# Patient Record
Sex: Female | Born: 1955 | Race: White | Hispanic: No | Marital: Married | Smoking: Never smoker
Health system: Southern US, Community
[De-identification: ages and names within clinical notes are randomized; demographics above are authoritative.]

## PROBLEM LIST (undated history)

## (undated) DIAGNOSIS — R9439 Abnormal result of other cardiovascular function study: Secondary | ICD-10-CM

## (undated) DIAGNOSIS — N200 Calculus of kidney: Secondary | ICD-10-CM

## (undated) HISTORY — PX: BREAST SURGERY: SHX581

---

## 2017-05-18 ENCOUNTER — Ambulatory Visit: Payer: Self-pay | Admitting: Medical

## 2017-05-18 ENCOUNTER — Encounter: Payer: Self-pay | Admitting: Medical

## 2017-05-18 VITALS — BP 116/82 | HR 88 | Temp 98.4°F | Wt 128.0 lb

## 2017-05-18 DIAGNOSIS — N952 Postmenopausal atrophic vaginitis: Secondary | ICD-10-CM | POA: Insufficient documentation

## 2017-05-18 DIAGNOSIS — N200 Calculus of kidney: Secondary | ICD-10-CM | POA: Insufficient documentation

## 2017-05-18 DIAGNOSIS — R0789 Other chest pain: Secondary | ICD-10-CM

## 2017-05-18 NOTE — Progress Notes (Addendum)
Subjective:    Patient ID: Phyllis Gonzalez, female    DOB: 03/31/1956, 61 y.o.   MRN: [id number]  HPI  61 yo female  5 days ago noticed feeling pressure in the left side back and pressure along the bra line, on occasion all the way across the back. chest and down arm and into the hand decreased sensation.Felt some weakness in the arm and into the hand. Thought initially it was a pinched nerve after doing latisamus dorsi pull downs and chest presses on Wedenday. Did go to the gym yesterday using the elliptical trainer which irritated it again with the same symptoms. However discomfort did not come on till she had stopped and about 30 minutes have gone by. Denies chest pain but felt chest  heaviness and pressure. Has taken nothing for pain as of yet rates the pain  3/10. Similar symptoms  5 years ago and was given a stress test  which was negative. Tingling down arm on Monday, lasted for about 3 minutes. Denies jaw a pain or pain going into the ear. Feels aching, dull , intermittent. No position seems to reproduce  the chest pressure or back discomfort. Denies shortness of breath.   Review of Systems  Constitutional: Negative for chills and fever.  HENT: Negative for congestion, ear pain and sore throat.   Eyes: Negative for pain, discharge and itching.  Respiratory: Negative for cough and shortness of breath.   Cardiovascular: Positive for chest pain.  Gastrointestinal: Negative for abdominal pain.  Endocrine: Negative for cold intolerance and heat intolerance.  Genitourinary: Negative for dysuria and hematuria.  Musculoskeletal: Positive for back pain and neck pain.  Skin: Negative for rash.  Allergic/Immunologic: Negative for environmental allergies and food allergies.  Neurological: Negative for dizziness, syncope and headaches.   Describes pain as pressure and  Discomfort, not pain. A couple of times noted neck pain on the left side recalls no specific movement that causes it to come  on.    Objective:   Physical Exam  Constitutional: She is oriented to person, place, and time. She appears well-developed and well-nourished.  HENT:  Head: Normocephalic and atraumatic.  Right Ear: External ear normal.  Left Ear: External ear normal.  Mouth/Throat: Oropharynx is clear and moist.  Eyes: Pupils are equal, round, and reactive to light. Conjunctivae and EOM are normal.  Neck: Normal range of motion. Neck supple.  Cardiovascular: Normal rate, regular rhythm and normal heart sounds.  Exam reveals no gallop and no friction rub.   No murmur heard. Pulmonary/Chest: Effort normal and breath sounds normal.  Musculoskeletal: Normal range of motion.  Neurological: She is alert and oriented to person, place, and time.  Skin: Skin is warm and dry.  Psychiatric: She has a normal mood and affect. Her behavior is normal. Judgment and thought content normal.  Nursing note and vitals reviewed.  Skin on back within normal limits. Unable to reproduce  pain through palpation chest discomfort or back pain., pushing hands against me causes no pain, . 5/5 strength of external and internal rotation , abduction or adduction all of which do not cause the patient any discomfort. 5/5 grip on the right and 4/5 grip on the left hand.   EKG   77 HR  Non specific lateral T wave changes. Assessment & Plan:  Chest  Pressure and discomfort and back discomfort cardiac vs musculoskelatal pain with radiation of decreased sensation in left arm and hand on occasion.reviewed with Dr,. Gilbert EKG and plan. Recommended  Aspirin 81 mg daily, avoid exercising for now. She would like her referral through her doctor who is located in [place]. She will hopefully see him tomorrow and get a referral to a cardiolgist. We discussed this could be both cardiac and musculoskeletal.  Reviewed EKG with patient. Also reviewed with patient if chest pressure or back discomfort worsens or if she wakes up in the middle of the night  with pain or shortness of breath recommended she call EMS and be further evaluated at the Emergency Department.  She verbalizes understanding and has no questions at discharge.

## 2017-05-18 NOTE — Patient Instructions (Signed)
Take  Aspirin 81 mg daily.  follow up with your doctor tomorrow. Call 911 if chest pain or shortness of breath occurs.  Return to [facility] in 3-5 days if not improving or if any concerns.    Angina Pectoris Angina pectoris is a very bad feeling in the chest, neck, or arm. Your doctor may call it angina. There are four types of angina. Angina is caused by a lack of blood in the middle and thickest layer of the heart wall (myocardium). Angina may feel like a crushing or squeezing pain in the chest. It may feel like tightness or heavy pressure in the chest. Some people say it feels like gas, heartburn, or indigestion. Some people have symptoms other than pain. These include:  Shortness of breath.  Cold sweats.  Feeling sick to your stomach (nausea).  Feeling light-headed.  Many women have chest discomfort and some of the other symptoms. However, women often have different symptoms, such as:  Feeling tired (fatigue).  Feeling nervous for no reason.  Feeling weak for no reason.  Dizziness or fainting.  Women may have angina without any symptoms. Follow these instructions at home:  Take medicines only as told by your doctor.  Take care of other health issues as told by your doctor. These include: ? High blood pressure (hypertension). ? Diabetes.  Follow a heart-healthy diet. Your doctor can help you to choose healthy food options and make changes.  Talk to your doctor to learn more about healthy cooking methods and use them. These include: ? Roasting. ? Grilling. ? Broiling. ? Baking. ? Poaching. ? Steaming. ? Stir-frying.  Follow an exercise program approved by your doctor.  Keep a healthy weight. Lose weight as told by your doctor.  Rest when you are tired.  Learn to manage stress.  Do not use any tobacco, such as cigarettes, chewing tobacco, or electronic cigarettes. If you need help quitting, ask your doctor.  If you drink alcohol, and your doctor says it is okay,  limit yourself to no more than 1 drink per day. One drink equals 12 ounces of beer, 5 ounces of wine, or 1 ounces of hard liquor.  Stop illegal drug use.  Keep all follow-up visits as told by your doctor. This is important. Do not take these medicines unless your doctor says that you can:  Nonsteroidal anti-inflammatory drugs (NSAIDs). These include: ? Ibuprofen. ? Naproxen. ? Celecoxib.  Vitamin supplements that have vitamin A, vitamin E, or both.  Hormone therapy that contains estrogen with or without progestin.  Get help right away if:  You have pain in your chest, neck, arm, jaw, stomach, or back that: ? Lasts more than a few minutes. ? Comes back. ? Does not get better after you take medicine under your tongue (sublingual nitroglycerin).  You have any of these symptoms for no reason: ? Gas, heartburn, or indigestion. ? Sweating a lot. ? Shortness of breath or trouble breathing. ? Feeling sick to your stomach or throwing up. ? Feeling more tired than usual. ? Feeling nervous or worrying more than usual. ? Feeling weak. ? Diarrhea.  You are suddenly dizzy or light-headed.  You faint or pass out. These symptoms may be an emergency. Do not wait to see if the symptoms will go away. Get medical help right away. Call your local emergency services (911 in the [place] Do not drive yourself to the hospital. This information is not intended to replace advice given to you by your health care provider.  Make sure you discuss any questions you have with your health care provider. Document Released: 04/06/2008 Document Revised: 03/26/2016 Document Reviewed: 02/20/2014 Elsevier Interactive Patient Education  2017 [employer]

## 2017-05-28 ENCOUNTER — Encounter: Payer: Self-pay | Admitting: Emergency Medicine

## 2017-05-28 ENCOUNTER — Emergency Department: Payer: BLUE CROSS/BLUE SHIELD

## 2017-05-28 ENCOUNTER — Observation Stay
Admission: EM | Admit: 2017-05-28 | Discharge: 2017-05-31 | Disposition: A | Discharge status: Home / Self Care | Payer: BLUE CROSS/BLUE SHIELD | Attending: Internal Medicine | Admitting: Internal Medicine

## 2017-05-28 DIAGNOSIS — I341 Nonrheumatic mitral (valve) prolapse: Secondary | ICD-10-CM | POA: Diagnosis not present

## 2017-05-28 DIAGNOSIS — R9439 Abnormal result of other cardiovascular function study: Secondary | ICD-10-CM | POA: Insufficient documentation

## 2017-05-28 DIAGNOSIS — I159 Secondary hypertension, unspecified: Secondary | ICD-10-CM

## 2017-05-28 DIAGNOSIS — R0789 Other chest pain: Secondary | ICD-10-CM | POA: Diagnosis not present

## 2017-05-28 DIAGNOSIS — R079 Chest pain, unspecified: Secondary | ICD-10-CM | POA: Diagnosis present

## 2017-05-28 HISTORY — DX: Calculus of kidney: N20.0

## 2017-05-28 HISTORY — DX: Abnormal result of other cardiovascular function study: R94.39

## 2017-05-28 LAB — BASIC METABOLIC PANEL
Anion gap: 9 (ref 5–15)
BUN: 12 mg/dL (ref 6–20)
CHLORIDE: 103 mmol/L (ref 101–111)
CO2: 28 mmol/L (ref 22–32)
Calcium: 10 mg/dL (ref 8.9–10.3)
Creatinine, Ser: 0.72 mg/dL (ref 0.44–1.00)
GFR calc Af Amer: >60 mL/min (ref >60)
GFR calc non Af Amer: >60 mL/min (ref >60)
Glucose, Bld: 95 mg/dL (ref 65–99)
POTASSIUM: 3.5 mmol/L (ref 3.5–5.1)
SODIUM: 140 mmol/L (ref 135–145)

## 2017-05-28 LAB — CBC
HEMATOCRIT: 40.8 % (ref 35.0–47.0)
Hemoglobin: 13.9 g/dL (ref 12.0–16.0)
MCH: 32.3 pg (ref 26.0–34.0)
MCHC: 34 g/dL (ref 32.0–36.0)
MCV: 95.1 fL (ref 80.0–100.0)
Platelets: 216 10*3/uL (ref 150–440)
RBC: 4.29 MIL/uL (ref 3.80–5.20)
RDW: 12.4 % (ref 11.5–14.5)
WBC: 6.7 10*3/uL (ref 3.6–11.0)

## 2017-05-28 LAB — PROTIME-INR
INR: 1.05
Prothrombin Time: 13.7 seconds (ref 11.4–15.2)

## 2017-05-28 LAB — APTT: APTT: 29 s (ref 24–36)

## 2017-05-28 LAB — TROPONIN I: Troponin I: <0.03 ng/mL (ref <0.03)

## 2017-05-28 MED ORDER — ACETAMINOPHEN 650 MG RE SUPP: 650.0000 mg | Freq: Four times a day (QID) | RECTAL | Status: DC | PRN

## 2017-05-28 MED ORDER — ACETAMINOPHEN 325 MG PO TABS
650.0000 mg | ORAL_TABLET | Freq: Four times a day (QID) | ORAL | Status: DC | PRN
  Administered 2017-05-29 – 2017-05-31 (×2): 650 mg via ORAL
  Filled 2017-05-28 (×3): qty 2

## 2017-05-28 MED ORDER — HEPARIN (PORCINE) IN NACL 100-0.45 UNIT/ML-% IJ SOLN
700.0000 [IU]/h | INTRAMUSCULAR | Status: DC
  Administered 2017-05-28 – 2017-05-30 (×2): 700 [IU]/h via INTRAVENOUS
  Filled 2017-05-28 (×3): qty 250

## 2017-05-28 MED ORDER — ASPIRIN 81 MG PO CHEW
324.0000 mg | CHEWABLE_TABLET | Freq: Once | ORAL | Status: AC
  Administered 2017-05-28: 324 mg via ORAL
  Filled 2017-05-28: qty 4

## 2017-05-28 MED ORDER — HEPARIN BOLUS VIA INFUSION
3500.0000 [IU] | Freq: Once | INTRAVENOUS | Status: AC
  Administered 2017-05-28: 3500 [IU] via INTRAVENOUS
  Filled 2017-05-28: qty 3500

## 2017-05-28 MED ORDER — ONDANSETRON HCL 4 MG/2ML IJ SOLN: 4.0000 mg | Freq: Four times a day (QID) | INTRAMUSCULAR | Status: DC | PRN

## 2017-05-28 MED ORDER — ASPIRIN 81 MG PO CHEW
CHEWABLE_TABLET | ORAL | Status: AC
  Filled 2017-05-28: qty 3

## 2017-05-28 MED ORDER — OXYCODONE HCL 5 MG PO TABS: 5.0000 mg | ORAL_TABLET | ORAL | Status: DC | PRN

## 2017-05-28 MED ORDER — NITROGLYCERIN 0.4 MG SL SUBL
0.4000 mg | SUBLINGUAL_TABLET | SUBLINGUAL | Status: DC | PRN
  Administered 2017-05-28 – 2017-05-30 (×2): 0.4 mg via SUBLINGUAL
  Filled 2017-05-28 (×2): qty 1

## 2017-05-28 MED ORDER — ONDANSETRON HCL 4 MG PO TABS: 4.0000 mg | ORAL_TABLET | Freq: Four times a day (QID) | ORAL | Status: DC | PRN

## 2017-05-28 MED ORDER — NITROGLYCERIN 2 % TD OINT
1.0000 [in_us] | TOPICAL_OINTMENT | Freq: Once | TRANSDERMAL | Status: AC
  Administered 2017-05-28: 1 [in_us] via TOPICAL
  Filled 2017-05-28: qty 1

## 2017-05-28 NOTE — ED Notes (Signed)
Pt to stat desk asking to leave.  Pt labs and xray reviewed by this nurse, VS repeated by triage RN.  Pt advised to follow up with cardiologist ASAP and return if sx worsen.  Pt verbalizes understanding.

## 2017-05-28 NOTE — ED Notes (Signed)
Pt transported to room 236 

## 2017-05-28 NOTE — ED Provider Notes (Signed)
MSE was initiated and I personally evaluated the patient and placed orders (if any) at  7:39 PM on May 28, 2017.  The patient appears stable so that the remainder of the MSE may be completed by another provider.  Patient was complaining of chest pain. Does have mildly abnormal EKG. Vital signs are otherwise stable at this time blood work is reassuring that she does state that she had an abnormal stress test was referred to a cardiologist still having persistent chest pain. I have encouraged the patient to stay for complete evaluation.   Willy Eddyobinson, Macallan Ord, MD 05/28/17 [phone]

## 2017-05-28 NOTE — ED Notes (Signed)
Report to floor finished att

## 2017-05-28 NOTE — Progress Notes (Signed)
ANTICOAGULATION CONSULT NOTE - Initial Consult  Pharmacy Consult for Heparin  Indication: chest pain/ACS  Allergies  Allergen Reactions  . Codeine Nausea Only    Patient Measurements:   Heparin Dosing Weight: 58.1 kg   Vital Signs: Temp: 97.8 F (36.6 C) (07/27 1820) Temp Source: Oral (07/27 1820) BP: 156/95 (07/27 2042) Pulse Rate: 73 (07/27 2042)  Labs:  Recent Labs  05/28/17 1820  HGB 13.9  HCT 40.8  PLT 216  CREATININE 0.72  TROPONINI <0.03    CrCl cannot be calculated (Unknown ideal weight.).   Medical History: Past Medical History:  Diagnosis Date  . Abnormal stress test   . Kidney stone     Medications:   (Not in a hospital admission)  Assessment: Pharmacy consulted to dose heparin in this 61 year old female admitted with   Goal of Therapy:  Heparin level 0.3-0.7 units/ml Monitor platelets by anticoagulation protocol: Yes   Plan:  Give 3500 units bolus x 1 Start heparin infusion at 700 units/hr Check anti-Xa level in 6 hours and daily while on heparin Continue to monitor H&H and platelets  Triston Skare D 05/28/2017,9:28 PM

## 2017-05-28 NOTE — ED Notes (Signed)
Dr. Roxan Hockeyobinson in to see pt in triage 1. Pt agreed to stay and be evaluated by physician.

## 2017-05-28 NOTE — ED Provider Notes (Signed)
[facility] Emergency Department Provider Note    None    (approximate)  I have reviewed the triage vital signs and the nursing notes.   HISTORY  Chief Complaint Chest Pain and Back Pain    HPI Luther ParodyRhonda Tessler is a 61 y.o. female presents with 2-1/2 weeks of escalating left chest pain radiating into her back and intermittent left arm tingling. Patient states that the symptoms are happening more frequently and lasting longer in nature. Denies any nausea. No diaphoresis. No syncopal events. Patient did have outpatient stress test which was abnormal.last week and has been referred to cardiology but has not seen a cardiologist. She does take a baby aspirin but is not on any other medications.   WALL SEGMENT CHANGES    RestStress  Anterior Septum:NormalHYPOKINETIC  Anterior Wall:NormalHYPOKINETIC   Lateral Wall:NormalHyperkinetic  Posterior Wall:NormalHyperkinetic  Inferior Wall:NormalHYPOKINETIC  Inferior Septum:NormalHyperkinetic   Apex:NormalHyperkinetic     Resting EF:>55% (Est.) Stress EF: 45% (Est.)    ___________________________________________________________________________________________    ADDITIONAL FINDINGS      ___________________________________________________________________________________________  STRESS ECG RESULTS     ECG Results:POSITIVE      ___________________________________________________________________________________________  ECHOCARDIOGRAPHIC DESCRIPTIONS    LEFT VENTRICLE  Size:Normal  Contraction:Normal   LV Masses:No Masses   WGN:FAOZLVH:None  Dias.FxClass:Normal    RIGHT VENTRICLE  Size:Normal Free Wall:Normal  Contraction:Normal RV Masses:No mass    PERICARDIUM   Fluid:No  effusion     Past Medical History:  Diagnosis Date  . Abnormal stress test   . Kidney stone    Family History  Problem Relation Age of Onset  . Hypertension Father   . Liver cancer Father    Past Surgical History:  Procedure Laterality Date  . BREAST SURGERY     Patient Active Problem List   Diagnosis Date Noted  . Chest pain 05/28/2017  . Kidney stones 05/18/2017  . Atrophic vaginitis 05/18/2017      Prior to Admission medications   Medication Sig Start Date End Date Taking? Authorizing Provider  estradiol (ESTRACE) 1 MG tablet Take 1 mg by mouth daily.    [provider]    Allergies Codeine    Social History Social History  Substance Use Topics  . Smoking status: Never Smoker  . Smokeless tobacco: Never Used  . Alcohol use 3.0 oz/week    5 Glasses of wine per week     Comment: occasionally    Review of Systems Patient denies headaches, rhinorrhea, blurry vision, numbness, shortness of breath, chest pain, edema, cough, abdominal pain, nausea, vomiting, diarrhea, dysuria, fevers, rashes or hallucinations unless otherwise stated above in HPI. ____________________________________________   PHYSICAL EXAM:  VITAL SIGNS: Vitals:   05/28/17 2042 05/28/17 2136  BP: (!) 156/95 (!) 154/97  Pulse: 73 65  Resp: 18 14  Temp:      Constitutional: Alert and oriented. Well appearing and in no acute distress. Eyes: Conjunctivae are normal.  Head: Atraumatic. Nose: No congestion/rhinnorhea. Mouth/Throat: Mucous membranes are moist.   Neck: No stridor. Painless ROM.  Cardiovascular: Normal rate, regular rhythm. Grossly normal heart sounds.  Good peripheral circulation. Respiratory: Normal respiratory effort.  No retractions. Lungs CTAB. Gastrointestinal: Soft and nontender. No distention. No abdominal bruits. No CVA tenderness. Genitourinary:  Musculoskeletal: No lower extremity tenderness nor edema.  No joint effusions. Neurologic:  Normal speech  and language. No gross focal neurologic deficits are appreciated. No facial droop Skin:  Skin is warm, dry and intact. No rash noted. Psychiatric: Mood and affect are  normal. Speech and behavior are normal.  ____________________________________________   LABS (all labs ordered are listed, but only abnormal results are displayed)  Results for orders placed or performed during the hospital encounter of 05/28/17 (from the past 24 hour(s))  Basic metabolic panel     Status: None   Collection Time: 05/28/17  6:20 PM  Result Value Ref Range   Sodium 140 135 - 145 mmol/L   Potassium 3.5 3.5 - 5.1 mmol/L   Chloride 103 101 - 111 mmol/L   CO2 28 22 - 32 mmol/L   Glucose, Bld 95 65 - 99 mg/dL   BUN 12 6 - 20 mg/dL   Creatinine, Ser 1.610.72 0.44 - 1.00 mg/dL   Calcium 09.610.0 8.9 - 04.510.3 mg/dL   GFR calc non Af Amer >60 >60 mL/min   GFR calc Af Amer >60 >60 mL/min   Anion gap 9 5 - 15  CBC     Status: None   Collection Time: 05/28/17  6:20 PM  Result Value Ref Range   WBC 6.7 3.6 - 11.0 K/uL   RBC 4.29 3.80 - 5.20 MIL/uL   Hemoglobin 13.9 12.0 - 16.0 g/dL   HCT 40.940.8 81.135.0 - 91.447.0 %   MCV 95.1 80.0 - 100.0 fL   MCH 32.3 26.0 - 34.0 pg   MCHC 34.0 32.0 - 36.0 g/dL   RDW 78.212.4 95.611.5 - 21.314.5 %   Platelets 216 150 - 440 K/uL  Troponin I     Status: None   Collection Time: 05/28/17  6:20 PM  Result Value Ref Range   Troponin I <0.03 <0.03 ng/mL   ____________________________________________  EKG My review and personal interpretation at Time: 17:50   Indication: chest pain  Rate: 70  Rhythm: sinus Axis: normal Other: nonspecific inferolateral st changes, no stemi, normal intervals ____________________________________________  RADIOLOGY  I personally reviewed all radiographic images ordered to evaluate for the above acute complaints and reviewed radiology reports and findings.  These findings were personally discussed with the patient.  Please see medical record for radiology  report.  ____________________________________________   PROCEDURES  Procedure(s) performed:  Procedures    Critical Care performed: yes CRITICAL CARE Performed by: Willy EddyPatrick Kadeem Hyle   Total critical care time: 30 minutes  Critical care time was exclusive of separately billable procedures and treating other patients.  Critical care was necessary to treat or prevent imminent or life-threatening deterioration.  Critical care was time spent personally by me on the following activities: development of treatment plan with patient and/or surrogate as well as nursing, discussions with consultants, evaluation of patient's response to treatment, examination of patient, obtaining history from patient or surrogate, ordering and performing treatments and interventions, ordering and review of laboratory studies, ordering and review of radiographic studies, pulse oximetry and re-evaluation of patient's condition.  ____________________________________________   INITIAL IMPRESSION / ASSESSMENT AND PLAN / ED COURSE  Pertinent labs & imaging results that were available during my care of the patient were reviewed by me and considered in my medical decision making (see chart for details).  DDX: ACS, pericarditis, esophagitis, boerhaaves, pe, dissection, pna, bronchitis, costochondritis   Luther ParodyRhonda Hovater is a 61 y.o. who presents to the ED with gradually worsening chest pain as described above for the past 2-1/2 weeks with a abnormal EKG and abnormal stress test performed as an outpatient. Patient's currently well-appearing is mildly hypertensive. Her troponin was negative but she does still have some discomfort at this time. Chest x-ray is unremarkable. This is not  clinically consistent with PE or dissection.  She has no evidence of intra-abdominal pathology that would be causing referred pain to the chest. Based on her presentation I am concerned for some spectrum of ACS. I spoke with Dr. Juliann Pares of  cardiology who agrees with my plan after starting IV heparin and nitrates. Patient has been given aspirin. Patient will be admitted to the hospitalist for further evaluation and management. Have discussed with the patient and available family all diagnostics and treatments performed thus far and all questions were answered to the best of my ability. The patient demonstrates understanding and agreement with plan.       ____________________________________________   FINAL CLINICAL IMPRESSION(S) / ED DIAGNOSES  Final diagnoses:  Chest pain, unspecified type  Secondary hypertension      NEW MEDICATIONS STARTED DURING THIS VISIT:  New Prescriptions   No medications on file     Note:  This document was prepared using Dragon voice recognition software and may include unintentional dictation errors.    Willy Eddy, MD 05/28/17 2142

## 2017-05-28 NOTE — ED Triage Notes (Signed)
Pt states that she had a stress test that came back abnormal and she has been referred to a [place] in [place] but unable to see them until next week. Today pt having chest pain that radiates into her back with occasional left arm numbness. Pt denies any other symptoms at this time.

## 2017-05-28 NOTE — ED Notes (Signed)
Pt is ambulatory up to front desk and asking about wait time. Pt informed that there are several people still ahead of her on time and we cannot give an exact answer. Pt asked "what happens if I choose to leave". Pt advised that she has the right to leave but we would like her to stay and be evaluated by a physician. Pt in to triage 1 with repeat VS. Pt states that she has an appointment on Friday with a cardiologist and will come back for evaluation with new or worsening symptoms. Pt appears in NAD at this time and is stating pain 1-2/10.

## 2017-05-28 NOTE — H&P (Signed)
[facility] [place] [facility]   PATIENT NAME: Phyllis Gonzalez    MR#:  [id number]  DATE OF BIRTH:  12/14/1955  DATE OF ADMISSION:  05/28/2017  PRIMARY CARE PHYSICIAN: Lafe GarinKallianos, John, MD   REQUESTING/REFERRING PHYSICIAN: Roxan Hockeyobinson, MD  CHIEF COMPLAINT:   Chief Complaint  Patient presents with  . Chest Pain  . Back Pain    HISTORY OF PRESENT ILLNESS:  Phyllis ParodyRhonda Dazey  is a 61 y.o. female who presents with Over weeks of intermittent but persistent chest pain. Patient was seen in the outpatient setting and had an abnormal stress echo showing anterior and inferior wall motion abnormalities concerning for multivessel CAD. She was scheduled to be seen outpatient for evaluation for elective catheter most likely. However, her pain increased significantly today and she came to the ED for evaluation. Here her workup has initially been within normal limits, but cardiology was contacted by ED physician and recommended admitting her for evaluation for possible further inpatient workup.  PAST MEDICAL HISTORY:   Past Medical History:  Diagnosis Date  . Abnormal stress test   . Kidney stone     PAST SURGICAL HISTORY:   Past Surgical History:  Procedure Laterality Date  . BREAST SURGERY      SOCIAL HISTORY:   Social History  Substance Use Topics  . Smoking status: Never Smoker  . Smokeless tobacco: Never Used  . Alcohol use 3.0 oz/week    5 Glasses of wine per week     Comment: occasionally    FAMILY HISTORY:   Family History  Problem Relation Age of Onset  . Hypertension Father   . Liver cancer Father     DRUG ALLERGIES:   Allergies  Allergen Reactions  . Codeine Nausea Only    MEDICATIONS AT HOME:   Prior to Admission medications   Medication Sig Start Date End Date Taking? Authorizing Provider  estradiol (ESTRACE) 1 MG tablet Take 1 mg by mouth daily.    [provider]    REVIEW OF SYSTEMS:  Review of Systems   Constitutional: Negative for chills, fever, malaise/fatigue and weight loss.  HENT: Negative for ear pain, hearing loss and tinnitus.   Eyes: Negative for blurred vision, double vision, pain and redness.  Respiratory: Negative for cough, hemoptysis and shortness of breath.   Cardiovascular: Positive for chest pain. Negative for palpitations, orthopnea and leg swelling.  Gastrointestinal: Negative for abdominal pain, constipation, diarrhea, nausea and vomiting.  Genitourinary: Negative for dysuria, frequency and hematuria.  Musculoskeletal: Negative for back pain, joint pain and neck pain.  Skin:       No acne, rash, or lesions  Neurological: Negative for dizziness, tremors, focal weakness and weakness.  Endo/Heme/Allergies: Negative for polydipsia. Does not bruise/bleed easily.  Psychiatric/Behavioral: Negative for depression. The patient is not nervous/anxious and does not have insomnia.      VITAL SIGNS:   Vitals:   05/28/17 1820 05/28/17 1925 05/28/17 2042  BP: (!) 146/89 (!) 143/95 (!) 156/95  Pulse: 64 72 73  Resp: 16 18 18   Temp: 97.8 F (36.6 C)    TempSrc: Oral    SpO2: 100% 100% 100%   Wt Readings from Last 3 Encounters:  05/18/17 58.1 kg (128 lb)    PHYSICAL EXAMINATION:  Physical Exam  Vitals reviewed. Constitutional: She is oriented to person, place, and time. She appears well-developed and well-nourished. No distress.  HENT:  Head: Normocephalic and atraumatic.  Mouth/Throat: Oropharynx is clear and moist.  Eyes: Pupils  are equal, round, and reactive to light. Conjunctivae and EOM are normal. No scleral icterus.  Neck: Normal range of motion. Neck supple. No JVD present. No thyromegaly present.  Cardiovascular: Normal rate, regular rhythm and intact distal pulses.  Exam reveals no gallop and no friction rub.   No murmur heard. Respiratory: Effort normal and breath sounds normal. No respiratory distress. She has no wheezes. She has no rales.  GI: Soft. Bowel  sounds are normal. She exhibits no distension. There is no tenderness.  Musculoskeletal: Normal range of motion. She exhibits no edema.  No arthritis, no gout  Lymphadenopathy:    She has no cervical adenopathy.  Neurological: She is alert and oriented to person, place, and time. No cranial nerve deficit.  No dysarthria, no aphasia  Skin: Skin is warm and dry. No rash noted. No erythema.  Psychiatric: She has a normal mood and affect. Her behavior is normal. Judgment and thought content normal.    LABORATORY PANEL:   CBC  Recent Labs Lab 05/28/17 1820  WBC 6.7  HGB 13.9  HCT 40.8  PLT 216   ------------------------------------------------------------------------------------------------------------------  Chemistries   Recent Labs Lab 05/28/17 1820  NA 140  K 3.5  CL 103  CO2 28  GLUCOSE 95  BUN 12  CREATININE 0.72  CALCIUM 10.0   ------------------------------------------------------------------------------------------------------------------  Cardiac Enzymes  Recent Labs Lab 05/28/17 1820  TROPONINI <0.03   ------------------------------------------------------------------------------------------------------------------  RADIOLOGY:  Dg Chest 2 View  Result Date: 05/28/2017 CLINICAL DATA:  Left chest pain radiating to the back. Left hand numbness intermittently for the past 2 weeks. EXAM: CHEST  2 VIEW COMPARISON:  None. FINDINGS: Normal sized heart. Clear lungs with normal vascularity. Minimal scoliosis and minimal thoracic spine degenerative changes. IMPRESSION: No acute abnormality. Electronically Signed   By: Beckie SaltsSteven  Reid M.D.   On: 05/28/2017 18:19    EKG:   Orders placed or performed during the hospital encounter of 05/28/17  . ED EKG within 10 minutes  . ED EKG within 10 minutes    IMPRESSION AND PLAN:  Principal Problem:   Chest pain - likely significant CAD. Recent abnormal stress test. Initial workup okay here today. Admit on cardiology's  recommendations, trend cardiac enzymes, echocardiogram, cardiology consult.  All the records are reviewed and case discussed with ED provider. Management plans discussed with the patient and/or family.  DVT PROPHYLAXIS: SubQ lovenox  GI PROPHYLAXIS: None  ADMISSION STATUS: Inpatient  CODE STATUS: Full Code Status History    This patient does not have a recorded code status. Please follow your organizational policy for patients in this situation.    Advance Directive Documentation     Most Recent Value  Type of Advance Directive  Healthcare Power of Attorney, Living will  Pre-existing out of facility DNR order (yellow form or pink MOST form)  -  "MOST" Form in Place?  -      TOTAL TIME TAKING CARE OF THIS PATIENT: 45 minutes.   Jushua Waltman FIELDING 05/28/2017, 9:14 PM  [facility]  Office  [phone]  CC: Primary care physician; Lafe GarinKallianos, John, MD  Note:  This document was prepared using Dragon voice recognition software and may include unintentional dictation errors.

## 2017-05-29 ENCOUNTER — Inpatient Hospital Stay
Admit: 2017-05-29 | Discharge: 2017-05-29 | Disposition: A | Discharge status: Home / Self Care | Payer: BLUE CROSS/BLUE SHIELD | Attending: Internal Medicine | Admitting: Internal Medicine

## 2017-05-29 LAB — BASIC METABOLIC PANEL
Anion gap: 6 (ref 5–15)
Anion gap: 4 — ABNORMAL LOW (ref 5–15)
BUN: 13 mg/dL (ref 6–20)
BUN: 10 mg/dL (ref 6–20)
CALCIUM: 9.8 mg/dL (ref 8.9–10.3)
CO2: 27 mmol/L (ref 22–32)
CO2: 29 mmol/L (ref 22–32)
CREATININE: 0.67 mg/dL (ref 0.44–1.00)
Calcium: 9.1 mg/dL (ref 8.9–10.3)
Chloride: 107 mmol/L (ref 101–111)
Chloride: 106 mmol/L (ref 101–111)
Creatinine, Ser: 0.59 mg/dL (ref 0.44–1.00)
GFR calc Af Amer: >60 mL/min (ref >60)
GFR calc non Af Amer: >60 mL/min (ref >60)
Glucose, Bld: 99 mg/dL (ref 65–99)
Glucose, Bld: 103 mg/dL — ABNORMAL HIGH (ref 65–99)
Potassium: 3.7 mmol/L (ref 3.5–5.1)
Potassium: 3.9 mmol/L (ref 3.5–5.1)
SODIUM: 140 mmol/L (ref 135–145)
Sodium: 139 mmol/L (ref 135–145)

## 2017-05-29 LAB — CBC
HCT: 39.6 % (ref 35.0–47.0)
HEMATOCRIT: 37.9 % (ref 35.0–47.0)
Hemoglobin: 12.7 g/dL (ref 12.0–16.0)
Hemoglobin: 13.5 g/dL (ref 12.0–16.0)
MCH: 31.7 pg (ref 26.0–34.0)
MCH: 32.3 pg (ref 26.0–34.0)
MCHC: 33.5 g/dL (ref 32.0–36.0)
MCHC: 34.1 g/dL (ref 32.0–36.0)
MCV: 94.7 fL (ref 80.0–100.0)
MCV: 94.9 fL (ref 80.0–100.0)
PLATELETS: 208 10*3/uL (ref 150–440)
PLATELETS: 207 10*3/uL (ref 150–440)
RBC: 4.01 MIL/uL (ref 3.80–5.20)
RBC: 4.17 MIL/uL (ref 3.80–5.20)
RDW: 12.6 % (ref 11.5–14.5)
RDW: 12.6 % (ref 11.5–14.5)
WBC: 5.9 10*3/uL (ref 3.6–11.0)
WBC: 5.6 10*3/uL (ref 3.6–11.0)

## 2017-05-29 LAB — HEPARIN LEVEL (UNFRACTIONATED)
HEPARIN UNFRACTIONATED: 0.44 [IU]/mL (ref 0.30–0.70)
HEPARIN UNFRACTIONATED: 0.41 [IU]/mL (ref 0.30–0.70)

## 2017-05-29 LAB — LIPID PANEL
CHOL/HDL RATIO: 2.1 ratio
Cholesterol: 245 mg/dL — ABNORMAL HIGH (ref 0–200)
HDL: 117 mg/dL (ref >40)
LDL Cholesterol: 119 mg/dL — ABNORMAL HIGH (ref 0–99)
Triglycerides: 44 mg/dL (ref <150)
VLDL: 9 mg/dL (ref 0–40)

## 2017-05-29 LAB — TROPONIN I: Troponin I: <0.03 ng/mL (ref <0.03)

## 2017-05-29 LAB — ECHOCARDIOGRAM COMPLETE
HEIGHTINCHES: 63 in
Weight: 2044.8 oz

## 2017-05-29 LAB — PROTIME-INR
INR: 1.06
PROTHROMBIN TIME: 13.8 s (ref 11.4–15.2)

## 2017-05-29 MED ORDER — SODIUM CHLORIDE 0.9 % WEIGHT BASED INFUSION: 1.0000 mL/kg/h | INTRAVENOUS | Status: DC

## 2017-05-29 MED ORDER — SODIUM CHLORIDE 0.9 % IV SOLN: 250.0000 mL | INTRAVENOUS | Status: DC | PRN

## 2017-05-29 MED ORDER — ASPIRIN 81 MG PO CHEW
81.0000 mg | CHEWABLE_TABLET | Freq: Every day | ORAL | Status: DC
  Administered 2017-05-29 – 2017-05-31 (×3): 81 mg via ORAL
  Filled 2017-05-29 (×3): qty 1

## 2017-05-29 MED ORDER — ROSUVASTATIN CALCIUM 10 MG PO TABS
10.0000 mg | ORAL_TABLET | Freq: Every day | ORAL | Status: DC
Start: 2017-05-29 — End: 2017-05-31
  Administered 2017-05-29 – 2017-05-31 (×3): 10 mg via ORAL
  Filled 2017-05-29 (×3): qty 1

## 2017-05-29 MED ORDER — SODIUM CHLORIDE 0.9% FLUSH
3.0000 mL | Freq: Two times a day (BID) | INTRAVENOUS | Status: DC
  Administered 2017-05-29 – 2017-05-30 (×4): 3 mL via INTRAVENOUS

## 2017-05-29 MED ORDER — SODIUM CHLORIDE 0.9 % WEIGHT BASED INFUSION: 3.0000 mL/kg/h | INTRAVENOUS | Status: AC

## 2017-05-29 MED ORDER — CARVEDILOL 3.125 MG PO TABS
3.1250 mg | ORAL_TABLET | Freq: Two times a day (BID) | ORAL | Status: DC
  Administered 2017-05-29 – 2017-05-31 (×5): 3.125 mg via ORAL
  Filled 2017-05-29 (×5): qty 1

## 2017-05-29 MED ORDER — SODIUM CHLORIDE 0.9% FLUSH
3.0000 mL | INTRAVENOUS | Status: DC | PRN
  Administered 2017-05-29: 3 mL via INTRAVENOUS
  Filled 2017-05-29: qty 3

## 2017-05-29 MED ORDER — ASPIRIN 81 MG PO CHEW: 81.0000 mg | CHEWABLE_TABLET | ORAL | Status: AC

## 2017-05-29 NOTE — Consult Note (Signed)
Phyllis Gonzalez is a 61 y.o. female  [id number]  Primary Cardiologist: Adrian BlackwaterShaukat Mialynn Shelvin Reason for Consultation: Chest pain  HPI: This is a 61 year old white female with a past medical history of kidney stones presented to the hospital with chest pain at rest associated with shortness of breath. Patient works at [employer] and had echo cardiogram with stress echo on [facility]/26/2017 [facility] [place] which showed normal ejection fraction but septal and inferior hypokinesis at peak exercise suggestive of ischemia in the inferior septal and anterior wall suggestive of multivessel disease. She had not seen any cardiologist yet but just had a stress echo over there. She still having intermittent chest pain at rest in spite of being on heparin.   Review of Systems: No orthopnea PND or leg swelling   Past Medical History:  Diagnosis Date  . Abnormal stress test   . Kidney stone     Medications Prior to Admission  Medication Sig Dispense Refill  . estradiol (ESTRACE) 0.1 MG/GM vaginal cream place 1 gram vaginally at bedtime for 2 weeks then DECREASE TO 1 gram ONCE OR TWICE A WEEK  0     . aspirin  81 mg Oral Daily  . carvedilol  3.125 mg Oral BID WC  . rosuvastatin  10 mg Oral Daily    Infusions: . heparin 700 Units/hr (05/28/17 2301)    Allergies  Allergen Reactions  . Codeine Nausea Only    Social History   Social History  . Marital status: Married    Spouse name: N/A  . Number of children: N/A  . Years of education: N/A   Occupational History  . Not on file.   Social History Main Topics  . Smoking status: Never Smoker  . Smokeless tobacco: Never Used  . Alcohol use 3.0 oz/week    5 Glasses of wine per week     Comment: occasionally  . Drug use: No  . Sexual activity: Not on file   Other Topics Concern  . Not on file   Social History Narrative  . No narrative on file    Family History  Problem Relation Age of Onset  . Hypertension  Father   . Liver cancer Father     PHYSICAL EXAM: Vitals:   05/29/17 0326 05/29/17 0939  BP: 117/73 124/84  Pulse: 64 75  Resp: 18 19  Temp: 97.7 F (36.5 C)      Intake/Output Summary (Last 24 hours) at 05/29/17 1145 Last data filed at 05/29/17 0403  Gross per 24 hour  Intake            34.88 ml  Output              150 ml  Net          -115.12 ml    General:  Well appearing. No respiratory difficulty HEENT: normal Neck: supple. no JVD. Carotids 2+ bilat; no bruits. No lymphadenopathy or thryomegaly appreciated. Cor: PMI nondisplaced. Regular rate & rhythm. No rubs, gallops or murmurs. Lungs: clear Abdomen: soft, nontender, nondistended. No hepatosplenomegaly. No bruits or masses. Good bowel sounds. Extremities: no cyanosis, clubbing, rash, edema Neuro: alert & oriented x 3, cranial nerves grossly intact. moves all 4 extremities w/o difficulty. Affect pleasant.  NWG:NFAOZECG:Sinus rhythm no acute ST ST changes  Results for orders placed or performed during the hospital encounter of 05/28/17 (from the past 24 hour(s))  Basic metabolic panel     Status: None   Collection  Time: 05/28/17  6:20 PM  Result Value Ref Range   Sodium 140 135 - 145 mmol/L   Potassium 3.5 3.5 - 5.1 mmol/L   Chloride 103 101 - 111 mmol/L   CO2 28 22 - 32 mmol/L   Glucose, Bld 95 65 - 99 mg/dL   BUN 12 6 - 20 mg/dL   Creatinine, Ser 1.610.72 0.44 - 1.00 mg/dL   Calcium 09.610.0 8.9 - 04.510.3 mg/dL   GFR calc non Af Amer >60 >60 mL/min   GFR calc Af Amer >60 >60 mL/min   Anion gap 9 5 - 15  CBC     Status: None   Collection Time: 05/28/17  6:20 PM  Result Value Ref Range   WBC 6.7 3.6 - 11.0 K/uL   RBC 4.29 3.80 - 5.20 MIL/uL   Hemoglobin 13.9 12.0 - 16.0 g/dL   HCT 40.940.8 81.135.0 - 91.447.0 %   MCV 95.1 80.0 - 100.0 fL   MCH 32.3 26.0 - 34.0 pg   MCHC 34.0 32.0 - 36.0 g/dL   RDW 78.212.4 95.611.5 - 21.314.5 %   Platelets 216 150 - 440 K/uL  Troponin I     Status: None   Collection Time: 05/28/17  6:20 PM  Result Value Ref  Range   Troponin I <0.03 <0.03 ng/mL  Protime-INR     Status: None   Collection Time: 05/28/17  9:53 PM  Result Value Ref Range   Prothrombin Time 13.7 11.4 - 15.2 seconds   INR 1.05   APTT     Status: None   Collection Time: 05/28/17  9:53 PM  Result Value Ref Range   aPTT 29 24 - 36 seconds  Troponin I     Status: None   Collection Time: 05/29/17 12:26 AM  Result Value Ref Range   Troponin I <0.03 <0.03 ng/mL  Basic metabolic panel     Status: None   Collection Time: 05/29/17  5:55 AM  Result Value Ref Range   Sodium 140 135 - 145 mmol/L   Potassium 3.7 3.5 - 5.1 mmol/L   Chloride 107 101 - 111 mmol/L   CO2 27 22 - 32 mmol/L   Glucose, Bld 99 65 - 99 mg/dL   BUN 13 6 - 20 mg/dL   Creatinine, Ser 0.860.59 0.44 - 1.00 mg/dL   Calcium 9.1 8.9 - 57.810.3 mg/dL   GFR calc non Af Amer >60 >60 mL/min   GFR calc Af Amer >60 >60 mL/min   Anion gap 6 5 - 15  CBC     Status: None   Collection Time: 05/29/17  5:55 AM  Result Value Ref Range   WBC 5.9 3.6 - 11.0 K/uL   RBC 4.01 3.80 - 5.20 MIL/uL   Hemoglobin 12.7 12.0 - 16.0 g/dL   HCT 46.937.9 62.935.0 - 52.847.0 %   MCV 94.7 80.0 - 100.0 fL   MCH 31.7 26.0 - 34.0 pg   MCHC 33.5 32.0 - 36.0 g/dL   RDW 41.312.6 24.411.5 - 01.014.5 %   Platelets 208 150 - 440 K/uL  Troponin I     Status: None   Collection Time: 05/29/17  5:55 AM  Result Value Ref Range   Troponin I <0.03 <0.03 ng/mL  Heparin level (unfractionated)     Status: None   Collection Time: 05/29/17  5:55 AM  Result Value Ref Range   Heparin Unfractionated 0.44 0.30 - 0.70 IU/mL   Dg Chest 2 View  Result Date: 05/28/2017 CLINICAL DATA:  Left chest pain radiating to the back. Left hand numbness intermittently for the past 2 weeks. EXAM: CHEST  2 VIEW COMPARISON:  None. FINDINGS: Normal sized heart. Clear lungs with normal vascularity. Minimal scoliosis and minimal thoracic spine degenerative changes. IMPRESSION: No acute abnormality. Electronically Signed   By: Beckie Salts M.D.   On: 05/28/2017  18:19     ASSESSMENT AND PLAN:Acute coronary syndrome with abnormal stress test suggestive of multivessel coronary artery disease on stress echo done at [facility] on 05/26/2017, patient was explained risk and benefits and has agreed to having cardiac catheterization Monday morning. Patient will be put on schedule.  Phyllis Gonzalez A

## 2017-05-29 NOTE — Progress Notes (Signed)
ANTICOAGULATION CONSULT NOTE - Initial Consult  Pharmacy Consult for Heparin  Indication: chest pain/ACS  Allergies  Allergen Reactions  . Codeine Nausea Only    Patient Measurements: Height: 5\' 3"  (160 cm) Weight: 127 lb 12.8 oz (58 kg) IBW/kg (Calculated) : 52.4 Heparin Dosing Weight: 58.1 kg   Vital Signs: Temp: 98.2 F (36.8 C) (07/28 1236) Temp Source: Oral (07/28 1236) BP: 130/87 (07/28 1236) Pulse Rate: 64 (07/28 1236)  Labs:  Recent Labs  05/28/17 1820 05/28/17 2153 05/29/17 0026 05/29/17 0555 05/29/17 1151  HGB 13.9  --   --  12.7  --   HCT 40.8  --   --  37.9  --   PLT 216  --   --  208  --   APTT  --  29  --   --   --   LABPROT  --  13.7  --   --   --   INR  --  1.05  --   --   --   HEPARINUNFRC  --   --   --  0.44 0.41  CREATININE 0.72  --   --  0.59  --   TROPONINI <0.03  --  <0.03 <0.03  --     Estimated Creatinine Clearance: 63.4 mL/min (by C-G formula based on SCr of 0.59 mg/dL).   Medical History: Past Medical History:  Diagnosis Date  . Abnormal stress test   . Kidney stone     Medications:  Prescriptions Prior to Admission  Medication Sig Dispense Refill Last Dose  . estradiol (ESTRACE) 0.1 MG/GM vaginal cream place 1 gram vaginally at bedtime for 2 weeks then DECREASE TO 1 gram ONCE OR TWICE A WEEK  0 unknown at unknown    Assessment: Pharmacy consulted to dose heparin in this 61 year old female admitted with   Goal of Therapy:  Heparin level 0.3-0.7 units/ml Monitor platelets by anticoagulation protocol: Yes   Plan:  Give 3500 units bolus x 1 Start heparin infusion at 700 units/hr Check anti-Xa level in 6 hours and daily while on heparin Continue to monitor H&H and platelets   7/28 0600 AM heparin level 0.44. Continue current regimen. Recheck in 6 hours to confirm. 7/28: Repeat HL therapeutic. Will continue current regimen. Will recheck in am.   Zaliyah Meikle D 05/29/2017,1:14 PM

## 2017-05-29 NOTE — Progress Notes (Signed)
*  PRELIMINARY RESULTS* Echocardiogram 2D Echocardiogram has been performed.  Garrel Ridgelikeshia S Zurri Rudden 05/29/2017, 11:36 AM

## 2017-05-29 NOTE — Plan of Care (Signed)
Problem: Cardiac: Goal: Ability to achieve and maintain adequate cardiovascular perfusion will improve Outcome: Progressing Pain free since arrival to floor.  No ectopics noted on telemetry.

## 2017-05-29 NOTE — Progress Notes (Signed)
ANTICOAGULATION CONSULT NOTE - Initial Consult  Pharmacy Consult for Heparin  Indication: chest pain/ACS  Allergies  Allergen Reactions  . Codeine Nausea Only    Patient Measurements: Height: 5\' 3"  (160 cm) Weight: 127 lb 12.8 oz (58 kg) IBW/kg (Calculated) : 52.4 Heparin Dosing Weight: 58.1 kg   Vital Signs: Temp: 97.7 F (36.5 C) (07/28 0326) Temp Source: Oral (07/28 0326) BP: 117/73 (07/28 0326) Pulse Rate: 64 (07/28 0326)  Labs:  Recent Labs  05/28/17 1820 05/28/17 2153 05/29/17 0026 05/29/17 0555  HGB 13.9  --   --  12.7  HCT 40.8  --   --  37.9  PLT 216  --   --  208  APTT  --  29  --   --   LABPROT  --  13.7  --   --   INR  --  1.05  --   --   HEPARINUNFRC  --   --   --  0.44  CREATININE 0.72  --   --   --   TROPONINI <0.03  --  <0.03 <0.03    Estimated Creatinine Clearance: 63.4 mL/min (by C-G formula based on SCr of 0.72 mg/dL).   Medical History: Past Medical History:  Diagnosis Date  . Abnormal stress test   . Kidney stone     Medications:  Prescriptions Prior to Admission  Medication Sig Dispense Refill Last Dose  . estradiol (ESTRACE) 1 MG tablet Take 1 mg by mouth daily.   Taking    Assessment: Pharmacy consulted to dose heparin in this 61 year old female admitted with   Goal of Therapy:  Heparin level 0.3-0.7 units/ml Monitor platelets by anticoagulation protocol: Yes   Plan:  Give 3500 units bolus x 1 Start heparin infusion at 700 units/hr Check anti-Xa level in 6 hours and daily while on heparin Continue to monitor H&H and platelets   7/28 0600 AM heparin level 0.44. Continue current regimen. Recheck in 6 hours to confirm.  Kristin Lamagna S 05/29/2017,6:51 AM

## 2017-05-29 NOTE — Progress Notes (Signed)
[facility] [place] [facility]   PATIENT NAME: Phyllis ParodyRhonda Gonzalez    MR#:  [id number]  DATE OF BIRTH:  02/12/1956  SUBJECTIVE:   Patient presents to the emergency room with chest pain. She had an abnormal stress test last week. She has no chest pain currently. She has a headache from the nitroglycerin.  REVIEW OF SYSTEMS:    Review of Systems  Constitutional: Negative for fever, chills weight loss HENT: Negative for ear pain, nosebleeds, congestion, facial swelling, rhinorrhea, neck pain, neck stiffness and ear discharge.   Respiratory: Negative for cough, shortness of breath, wheezing  Cardiovascular: Negative for chest pain, palpitations and leg swelling.  Gastrointestinal: Negative for heartburn, abdominal pain, vomiting, diarrhea or consitpation Genitourinary: Negative for dysuria, urgency, frequency, hematuria Musculoskeletal: Negative for back pain or joint pain Neurological: Negative for dizziness, seizures, syncope, focal weakness,  numbness Positive headaches.  Hematological: Does not bruise/bleed easily.  Psychiatric/Behavioral: Negative for hallucinations, confusion, dysphoric mood    Tolerating Diet: yes      DRUG ALLERGIES:   Allergies  Allergen Reactions  . Codeine Nausea Only    VITALS:  Blood pressure 117/73, pulse 64, temperature 97.7 F (36.5 C), temperature source Oral, resp. rate 18, height 5\' 3"  (1.6 m), weight 58 kg (127 lb 12.8 oz), SpO2 97 %.  PHYSICAL EXAMINATION:  Constitutional: Appears well-developed and well-nourished. No distress. HENT: Normocephalic. [place] Kitchen. Oropharynx is clear and moist.  Eyes: Conjunctivae and EOM are normal. PERRLA, no scleral icterus.  Neck: Normal ROM. Neck supple. No JVD. No tracheal deviation. CVS: RRR, S1/S2 +, no murmurs, no gallops, no carotid bruit.  Pulmonary: Effort and breath sounds normal, no stridor, rhonchi, wheezes, rales.  Abdominal: Soft. BS +,  no distension, tenderness, rebound or  guarding.  Musculoskeletal: Normal range of motion. No edema and no tenderness.  Neuro: Alert. CN 2-12 grossly intact. No focal deficits. Skin: Skin is warm and dry. No rash noted. Psychiatric: Normal mood and affect.      LABORATORY PANEL:   CBC  Recent Labs Lab 05/29/17 0555  WBC 5.9  HGB 12.7  HCT 37.9  PLT 208   ------------------------------------------------------------------------------------------------------------------  Chemistries   Recent Labs Lab 05/29/17 0555  NA 140  K 3.7  CL 107  CO2 27  GLUCOSE 99  BUN 13  CREATININE 0.59  CALCIUM 9.1   ------------------------------------------------------------------------------------------------------------------  Cardiac Enzymes  Recent Labs Lab 05/28/17 1820 05/29/17 0026 05/29/17 0555  TROPONINI <0.03 <0.03 <0.03   ------------------------------------------------------------------------------------------------------------------  RADIOLOGY:  Dg Chest 2 View  Result Date: 05/28/2017 CLINICAL DATA:  Left chest pain radiating to the back. Left hand numbness intermittently for the past 2 weeks. EXAM: CHEST  2 VIEW COMPARISON:  None. FINDINGS: Normal sized heart. Clear lungs with normal vascularity. Minimal scoliosis and minimal thoracic spine degenerative changes. IMPRESSION: No acute abnormality. Electronically Signed   By: Beckie SaltsSteven  Reid M.D.   On: 05/28/2017 18:19     ASSESSMENT AND PLAN:   61 year old female with no significant past medical history who presented with chest pain. She had an abnormal echo stress test showing anterior and inferior wall motion abnormalities concerning for multivessel CAD last week.  1. Unstable angina: Patient will need to undergo cardiac catheterization given abnormal recent stress test. She has ruled out for acute coronary syndrome with negative troponins. Await cardiology consultation for further recommendations Continue heparin Add aspirin, statin and beta  blocker Check lipid panel Follow up on echocardiogram    Management plans discussed with the patient  and she is in agreement.  CODE STATUS: full  TOTAL TIME TAKING CARE OF THIS PATIENT: 30 minutes.     POSSIBLE D/C tuesday, DEPENDING ON CLINICAL CONDITION.   Koya Hunger M.D on 05/29/2017 at 8:54 AM  Between 7am to 6pm - Pager - [phone] After 6pm go to [email or web address] - password [email or web address]  [facility]  Office  [phone]  CC: Primary care physician; Lafe GarinKallianos, John, MD  Note: This dictation was prepared with Dragon dictation along with smaller phrase technology. Any transcriptional errors that result from this process are unintentional.

## 2017-05-30 LAB — CBC
HEMATOCRIT: 39.8 % (ref 35.0–47.0)
Hemoglobin: 13.4 g/dL (ref 12.0–16.0)
MCH: 31.9 pg (ref 26.0–34.0)
MCHC: 33.7 g/dL (ref 32.0–36.0)
MCV: 94.5 fL (ref 80.0–100.0)
PLATELETS: 192 10*3/uL (ref 150–440)
RBC: 4.21 MIL/uL (ref 3.80–5.20)
RDW: 12.6 % (ref 11.5–14.5)
WBC: 5.6 10*3/uL (ref 3.6–11.0)

## 2017-05-30 LAB — HIV ANTIBODY (ROUTINE TESTING W REFLEX): HIV Screen 4th Generation wRfx: NONREACTIVE

## 2017-05-30 LAB — HEPARIN LEVEL (UNFRACTIONATED): HEPARIN UNFRACTIONATED: 0.39 [IU]/mL (ref 0.30–0.70)

## 2017-05-30 MED ORDER — MORPHINE SULFATE (PF) 2 MG/ML IV SOLN: 1.0000 mg | INTRAVENOUS | Status: DC | PRN

## 2017-05-30 MED ORDER — ASPIRIN 81 MG PO CHEW
81.0000 mg | CHEWABLE_TABLET | ORAL | Status: AC
  Administered 2017-05-31: 81 mg via ORAL
  Filled 2017-05-30: qty 1

## 2017-05-30 MED ORDER — SODIUM CHLORIDE 0.9 % WEIGHT BASED INFUSION
1.0000 mL/kg/h | INTRAVENOUS | Status: DC
  Administered 2017-05-31: 1 mL/kg/h via INTRAVENOUS
  Administered 2017-05-31: 3 mL/kg/h via INTRAVENOUS

## 2017-05-30 NOTE — Plan of Care (Signed)
Problem: Cardiac: Goal: Ability to achieve and maintain adequate cardiovascular perfusion will improve Outcome: Progressing Remains free of chest pain,  SR noted on telemetry.

## 2017-05-30 NOTE — Progress Notes (Signed)
[employer] - [place] at [facility]   PATIENT NAME: Phyllis ParodyRhonda Vitullo    MR#:  [id number]  DATE OF BIRTH:  04/13/1956  SUBJECTIVE:   Patient had some mild chest pain last night. She has headache with nitroglycerin REVIEW OF SYSTEMS:    Review of Systems  Constitutional: Negative for fever, chills weight loss HENT: Negative for ear pain, nosebleeds, congestion, facial swelling, rhinorrhea, neck pain, neck stiffness and ear discharge.   Respiratory: Negative for cough, shortness of breath, wheezing  Cardiovascular: Negative for chest pain currently, palpitations and leg swelling.  Gastrointestinal: Negative for heartburn, abdominal pain, vomiting, diarrhea or consitpation Genitourinary: Negative for dysuria, urgency, frequency, hematuria Musculoskeletal: Negative for back pain or joint pain Neurological: Negative for dizziness, seizures, syncope, focal weakness,  numbness Hematological: Does not bruise/bleed easily.  Psychiatric/Behavioral: Negative for hallucinations, confusion, dysphoric mood    Tolerating Diet: yes      DRUG ALLERGIES:   Allergies  Allergen Reactions  . Codeine Nausea Only    VITALS:  Blood pressure 108/60, pulse 68, temperature 98.1 F (36.7 C), temperature source Oral, resp. rate 16, height 5\' 3"  (1.6 m), weight 58 kg (127 lb 12.8 oz), SpO2 96 %.  PHYSICAL EXAMINATION:  Constitutional: Appears well-developed and well-nourished. No distress. HENT: Normocephalic. Marland Kitchen. Oropharynx is clear and moist.  Eyes: Conjunctivae and EOM are normal. PERRLA, no scleral icterus.  Neck: Normal ROM. Neck supple. No JVD. No tracheal deviation. CVS: RRR, S1/S2 +, no murmurs, no gallops, no carotid bruit.  Pulmonary: Effort and breath sounds normal, no stridor, rhonchi, wheezes, rales.  Abdominal: Soft. BS +,  no distension, tenderness, rebound or guarding.  Musculoskeletal: Normal range of motion. No edema and no tenderness.  Neuro: Alert. CN 2-12 grossly  intact. No focal deficits. Skin: Skin is warm and dry. No rash noted. Psychiatric: Normal mood and affect.      LABORATORY PANEL:   CBC  Recent Labs Lab 05/30/17 0452  WBC 5.6  HGB 13.4  HCT 39.8  PLT 192   ------------------------------------------------------------------------------------------------------------------  Chemistries   Recent Labs Lab 05/29/17 1302  NA 139  K 3.9  CL 106  CO2 29  GLUCOSE 103*  BUN 10  CREATININE 0.67  CALCIUM 9.8   ------------------------------------------------------------------------------------------------------------------  Cardiac Enzymes  Recent Labs Lab 05/28/17 1820 05/29/17 0026 05/29/17 0555  TROPONINI <0.03 <0.03 <0.03   ------------------------------------------------------------------------------------------------------------------  RADIOLOGY:  Dg Chest 2 View  Result Date: 05/28/2017 CLINICAL DATA:  Left chest pain radiating to the back. Left hand numbness intermittently for the past 2 weeks. EXAM: CHEST  2 VIEW COMPARISON:  None. FINDINGS: Normal sized heart. Clear lungs with normal vascularity. Minimal scoliosis and minimal thoracic spine degenerative changes. IMPRESSION: No acute abnormality. Electronically Signed   By: Beckie SaltsSteven  Reid M.D.   On: 05/28/2017 18:19     ASSESSMENT AND PLAN:   61 year old female with no significant past medical history who presented with chest pain. She had an abnormal echo stress test showing anterior and inferior wall motion abnormalities concerning for multivessel CAD last week.  1. Unstable angina: Patient will undergo cardiac catheterization given abnormal recent stress test Tomorrow. She has ruled out for acute coronary syndrome with negative troponins. Continue heparin gtt,  Patient prefers morphine over nitroglycerin for recurrent chest pain Continue A spirin, statin and beta blocker  LDL 119  Echocardiogram shows diastolic dysfunction without wall motion  abnormalities.    Management plans discussed with the patient and she is in agreement.  CODE STATUS: full  TOTAL TIME TAKING CARE OF THIS PATIENT: 21 minutes.     POSSIBLE D/C tuesday, DEPENDING ON CLINICAL CONDITION.   Himmat Enberg M.D on 05/30/2017 at 10:04 AM  Between 7am to 6pm - Pager - [phone] After 6pm go to [email or web address] - password [email or web address]  [facility] [place] [facility]  Office  [phone]  CC: Primary care physician; Lafe GarinKallianos, John, MD  Note: This dictation was prepared with Dragon dictation along with smaller phrase technology. Any transcriptional errors that result from this process are unintentional.

## 2017-05-30 NOTE — Progress Notes (Signed)
ANTICOAGULATION CONSULT NOTE - Initial Consult  Pharmacy Consult for Heparin  Indication: chest pain/ACS  Allergies  Allergen Reactions  . Codeine Nausea Only    Patient Measurements: Height: 5\' 3"  (160 cm) Weight: 127 lb 12.8 oz (58 kg) IBW/kg (Calculated) : 52.4 Heparin Dosing Weight: 58.1 kg   Vital Signs: Temp: 98 F (36.7 C) (07/29 0336) Temp Source: Oral (07/29 0336) BP: 135/87 (07/29 0336) Pulse Rate: 61 (07/29 0336)  Labs:  Recent Labs  05/28/17 1820 05/28/17 2153 05/29/17 0026 05/29/17 0555 05/29/17 1151 05/29/17 1302 05/30/17 0452  HGB 13.9  --   --  12.7  --  13.5 13.4  HCT 40.8  --   --  37.9  --  39.6 39.8  PLT 216  --   --  208  --  207 192  APTT  --  29  --   --   --   --   --   LABPROT  --  13.7  --   --   --  13.8  --   INR  --  1.05  --   --   --  1.06  --   HEPARINUNFRC  --   --   --  0.44 0.41  --  0.39  CREATININE 0.72  --   --  0.59  --  0.67  --   TROPONINI <0.03  --  <0.03 <0.03  --   --   --     Estimated Creatinine Clearance: 63.4 mL/min (by C-G formula based on SCr of 0.67 mg/dL).   Medical History: Past Medical History:  Diagnosis Date  . Abnormal stress test   . Kidney stone     Medications:  Prescriptions Prior to Admission  Medication Sig Dispense Refill Last Dose  . estradiol (ESTRACE) 0.1 MG/GM vaginal cream place 1 gram vaginally at bedtime for 2 weeks then DECREASE TO 1 gram ONCE OR TWICE A WEEK  0 unknown at unknown    Assessment: Pharmacy consulted to dose heparin in this 61 year old female admitted with   Goal of Therapy:  Heparin level 0.3-0.7 units/ml Monitor platelets by anticoagulation protocol: Yes   Plan:  Give 3500 units bolus x 1 Start heparin infusion at 700 units/hr Check anti-Xa level in 6 hours and daily while on heparin Continue to monitor H&H and platelets   7/28 0600 AM heparin level 0.44. Continue current regimen. Recheck in 6 hours to confirm. 7/28: Repeat HL therapeutic. Will continue  current regimen. Will recheck in am.   7/29 heparin level 0.39. Continue current regimen. Recheck heparin level and CBC with tomorrow AM labs.  Alicja Everitt S 05/30/2017,5:52 AM

## 2017-05-30 NOTE — Progress Notes (Signed)
SUBJECTIVE: Patient continues to have intermittent chest pain   Vitals:   05/30/17 0611 05/30/17 0616 05/30/17 0828 05/30/17 1116  BP: 112/77 112/78 108/60 122/75  Pulse: 71 75 68 79  Resp:   16 18  Temp:   98.1 F (36.7 C) 98.8 F (37.1 C)  TempSrc:   Oral Oral  SpO2: 95%  96% 98%  Weight:      Height:        Intake/Output Summary (Last 24 hours) at 05/30/17 1240 Last data filed at 05/30/17 1100  Gross per 24 hour  Intake           374.87 ml  Output             2250 ml  Net         -1875.13 ml    LABS: Basic Metabolic Panel:  Recent Labs  23/55/7307/28/18 0555 05/29/17 1302  NA 140 139  K 3.7 3.9  CL 107 106  CO2 27 29  GLUCOSE 99 103*  BUN 13 10  CREATININE 0.59 0.67  CALCIUM 9.1 9.8   Liver Function Tests: No results for input(s): AST, ALT, ALKPHOS, BILITOT, PROT, ALBUMIN in the last 72 hours. No results for input(s): LIPASE, AMYLASE in the last 72 hours. CBC:  Recent Labs  05/29/17 1302 05/30/17 0452  WBC 5.6 5.6  HGB 13.5 13.4  HCT 39.6 39.8  MCV 94.9 94.5  PLT 207 192   Cardiac Enzymes:  Recent Labs  05/28/17 1820 05/29/17 0026 05/29/17 0555  TROPONINI <0.03 <0.03 <0.03   BNP: Invalid input(s): POCBNP D-Dimer: No results for input(s): DDIMER in the last 72 hours. Hemoglobin A1C: No results for input(s): HGBA1C in the last 72 hours. Fasting Lipid Panel:  Recent Labs  05/29/17 1151  CHOL 245*  HDL 117  LDLCALC 119*  TRIG 44  CHOLHDL 2.1   Thyroid Function Tests: No results for input(s): TSH, T4TOTAL, T3FREE, THYROIDAB in the last 72 hours.  Invalid input(s): FREET3 Anemia Panel: No results for input(s): VITAMINB12, FOLATE, FERRITIN, TIBC, IRON, RETICCTPCT in the last 72 hours.   PHYSICAL EXAM General: Well developed, well nourished, in no acute distress HEENT:  Normocephalic and atramatic Neck:  No JVD.  Lungs: Clear bilaterally to auscultation and percussion. Heart: HRRR . Normal S1 and S2 without gallops or murmurs.   Abdomen: Bowel sounds are positive, abdomen soft and non-tender  Msk:  Back normal, normal gait. Normal strength and tone for age. Extremities: No clubbing, cyanosis or edema.   Neuro: Alert and oriented X 3. Psych:  Good affect, responds appropriately  TELEMETRY:Sinus rhythm  ASSESSMENT AND PLAN: Acute coronary syndrome with positive stress echocardiogram at [facility] with septal and inferior hypokinesis at peak exercise. Advise cardiac catheterization in the morning.  Principal Problem:   Chest pain    Lakea Mittelman A, MD, [facility] 05/30/2017 12:40 PM

## 2017-05-30 NOTE — Progress Notes (Signed)
Patient had questions about the cardiac cath.  I walked her through what to expect prior to,during, and after the cath.  Reviewed the site care after discharge.  She has the patient education sheets for  - Coronary Angiogram, and Coronary Angiogram after care

## 2017-05-30 NOTE — Progress Notes (Signed)
Episode mid sternal chest pain relieved with sublingual nitroglycerin X 1 tab.

## 2017-05-31 ENCOUNTER — Encounter: Payer: Self-pay | Admitting: Cardiovascular Disease

## 2017-05-31 ENCOUNTER — Encounter
Admission: EM | Disposition: A | Discharge status: Home / Self Care | Payer: Self-pay | Source: Home / Self Care | Attending: Student in an Organized Health Care Education/Training Program

## 2017-05-31 HISTORY — PX: LEFT HEART CATH AND CORONARY ANGIOGRAPHY: CATH118249

## 2017-05-31 LAB — BASIC METABOLIC PANEL
Anion gap: 7 (ref 5–15)
BUN: 13 mg/dL (ref 6–20)
CHLORIDE: 107 mmol/L (ref 101–111)
CO2: 29 mmol/L (ref 22–32)
Calcium: 9.4 mg/dL (ref 8.9–10.3)
Creatinine, Ser: 0.79 mg/dL (ref 0.44–1.00)
GFR calc non Af Amer: >60 mL/min (ref >60)
Glucose, Bld: 97 mg/dL (ref 65–99)
POTASSIUM: 3.9 mmol/L (ref 3.5–5.1)
SODIUM: 143 mmol/L (ref 135–145)

## 2017-05-31 LAB — HEPARIN LEVEL (UNFRACTIONATED): HEPARIN UNFRACTIONATED: 0.32 [IU]/mL (ref 0.30–0.70)

## 2017-05-31 LAB — CBC
HCT: 39.5 % (ref 35.0–47.0)
HEMOGLOBIN: 13.3 g/dL (ref 12.0–16.0)
MCH: 32 pg (ref 26.0–34.0)
MCHC: 33.6 g/dL (ref 32.0–36.0)
MCV: 95.1 fL (ref 80.0–100.0)
Platelets: 202 10*3/uL (ref 150–440)
RBC: 4.15 MIL/uL (ref 3.80–5.20)
RDW: 12.3 % (ref 11.5–14.5)
WBC: 7.4 10*3/uL (ref 3.6–11.0)

## 2017-05-31 SURGERY — LEFT HEART CATH AND CORONARY ANGIOGRAPHY
Anesthesia: Moderate Sedation | Laterality: Right

## 2017-05-31 MED ORDER — SODIUM CHLORIDE 0.9 % IV SOLN: 250.0000 mL | INTRAVENOUS | Status: DC | PRN

## 2017-05-31 MED ORDER — ONDANSETRON HCL 4 MG/2ML IJ SOLN: 4.0000 mg | Freq: Four times a day (QID) | INTRAMUSCULAR | Status: DC | PRN

## 2017-05-31 MED ORDER — ROSUVASTATIN CALCIUM 20 MG PO TABS: 20.0000 mg | ORAL_TABLET | Freq: Every day | ORAL | 11 refills | Status: DC

## 2017-05-31 MED ORDER — ASPIRIN EC 81 MG PO TBEC: 81.0000 mg | DELAYED_RELEASE_TABLET | Freq: Every day | ORAL | 0 refills | Status: AC

## 2017-05-31 MED ORDER — SODIUM CHLORIDE 0.9% FLUSH: 3.0000 mL | INTRAVENOUS | Status: DC | PRN

## 2017-05-31 MED ORDER — HEPARIN (PORCINE) IN NACL 2-0.9 UNIT/ML-% IJ SOLN
INTRAMUSCULAR | Status: AC
  Filled 2017-05-31: qty 500

## 2017-05-31 MED ORDER — SODIUM CHLORIDE 0.9 % WEIGHT BASED INFUSION: 1.0000 mL/kg/h | INTRAVENOUS | Status: DC

## 2017-05-31 MED ORDER — METOPROLOL TARTRATE 25 MG PO TABS: 25.0000 mg | ORAL_TABLET | Freq: Two times a day (BID) | ORAL | 0 refills | Status: DC

## 2017-05-31 MED ORDER — MIDAZOLAM HCL 2 MG/2ML IJ SOLN
INTRAMUSCULAR | Status: AC
  Filled 2017-05-31: qty 2

## 2017-05-31 MED ORDER — FENTANYL CITRATE (PF) 100 MCG/2ML IJ SOLN
INTRAMUSCULAR | Status: AC
  Filled 2017-05-31: qty 2

## 2017-05-31 MED ORDER — IOPAMIDOL (ISOVUE-300) INJECTION 61%
INTRAVENOUS | Status: DC | PRN
  Administered 2017-05-31: 85 mL via INTRA_ARTERIAL

## 2017-05-31 MED ORDER — FENTANYL CITRATE (PF) 100 MCG/2ML IJ SOLN
INTRAMUSCULAR | Status: DC | PRN
  Administered 2017-05-31: 50 ug via INTRAVENOUS

## 2017-05-31 MED ORDER — ROSUVASTATIN CALCIUM 20 MG PO TABS: 20.0000 mg | ORAL_TABLET | Freq: Every day | ORAL | 0 refills | Status: DC

## 2017-05-31 MED ORDER — ACETAMINOPHEN 325 MG PO TABS: 650.0000 mg | ORAL_TABLET | ORAL | Status: DC | PRN

## 2017-05-31 MED ORDER — ZOLPIDEM TARTRATE 5 MG PO TABS
5.0000 mg | ORAL_TABLET | Freq: Every evening | ORAL | Status: DC | PRN
  Administered 2017-05-31: 5 mg via ORAL
  Filled 2017-05-31: qty 1

## 2017-05-31 MED ORDER — MIDAZOLAM HCL 2 MG/2ML IJ SOLN
INTRAMUSCULAR | Status: DC | PRN
  Administered 2017-05-31: 1 mg via INTRAVENOUS

## 2017-05-31 MED ORDER — SODIUM CHLORIDE 0.9% FLUSH: 3.0000 mL | Freq: Two times a day (BID) | INTRAVENOUS | Status: DC

## 2017-05-31 SURGICAL SUPPLY — 9 items
CATH 5FR JR4 DIAGNOSTIC (CATHETERS) ×3 IMPLANT
CATH INFINITI 5FR ANG PIGTAIL (CATHETERS) ×3 IMPLANT
CATH INFINITI 5FR JL4 (CATHETERS) ×3 IMPLANT
DEVICE CLOSURE MYNXGRIP 5F (Vascular Products) ×3 IMPLANT
GUIDEWIRE 3MM J TIP .035 145 (WIRE) ×3 IMPLANT
KIT MANI 3VAL PERCEP (MISCELLANEOUS) ×3 IMPLANT
NEEDLE PERC 18GX7CM (NEEDLE) ×3 IMPLANT
PACK CARDIAC CATH (CUSTOM PROCEDURE TRAY) ×3 IMPLANT
SHEATH PINNACLE 5F 10CM (SHEATH) ×3 IMPLANT

## 2017-05-31 NOTE — Progress Notes (Signed)
Cardiac cath has normal coronaries and normal LVEF and mild mitral valve prolapse, may go home on metoprolol 25 mg bid and f/u Thursday at 9 am

## 2017-05-31 NOTE — Progress Notes (Signed)
SUBJECTIVE: Patient still has intermittent chest pain   Vitals:   05/30/17 0828 05/30/17 1116 05/30/17 1946 05/31/17 0452  BP: 108/60 122/75 131/79 109/64  Pulse: 68 79 81 66  Resp: 16 18 16 19   Temp: 98.1 F (36.7 C) 98.8 F (37.1 C) 98.3 F (36.8 C) (!) 97.5 F (36.4 C)  TempSrc: Oral Oral Oral Oral  SpO2: 96% 98% 99% 99%  Weight:    126 lb 4.8 oz (57.3 kg)  Height:        Intake/Output Summary (Last 24 hours) at 05/31/17 0806 Last data filed at 05/31/17 0748  Gross per 24 hour  Intake           402.32 ml  Output             2800 ml  Net         -2397.68 ml    LABS: Basic Metabolic Panel:  Recent Labs  16/08/9606/28/18 1302 05/31/17 0442  NA 139 143  K 3.9 3.9  CL 106 107  CO2 29 29  GLUCOSE 103* 97  BUN 10 13  CREATININE 0.67 0.79  CALCIUM 9.8 9.4   Liver Function Tests: No results for input(s): AST, ALT, ALKPHOS, BILITOT, PROT, ALBUMIN in the last 72 hours. No results for input(s): LIPASE, AMYLASE in the last 72 hours. CBC:  Recent Labs  05/30/17 0452 05/31/17 0442  WBC 5.6 7.4  HGB 13.4 13.3  HCT 39.8 39.5  MCV 94.5 95.1  PLT 192 202   Cardiac Enzymes:  Recent Labs  05/28/17 1820 05/29/17 0026 05/29/17 0555  TROPONINI <0.03 <0.03 <0.03   BNP: Invalid input(s): POCBNP D-Dimer: No results for input(s): DDIMER in the last 72 hours. Hemoglobin A1C: No results for input(s): HGBA1C in the last 72 hours. Fasting Lipid Panel:  Recent Labs  05/29/17 1151  CHOL 245*  HDL 117  LDLCALC 119*  TRIG 44  CHOLHDL 2.1   Thyroid Function Tests: No results for input(s): TSH, T4TOTAL, T3FREE, THYROIDAB in the last 72 hours.  Invalid input(s): FREET3 Anemia Panel: No results for input(s): VITAMINB12, FOLATE, FERRITIN, TIBC, IRON, RETICCTPCT in the last 72 hours.   PHYSICAL EXAM General: Well developed, well nourished, in no acute distress HEENT:  Normocephalic and atramatic Neck:  No JVD.  Lungs: Clear bilaterally to auscultation and  percussion. Heart: HRRR . Normal S1 and S2 without gallops or murmurs.  Abdomen: Bowel sounds are positive, abdomen soft and non-tender  Msk:  Back normal, normal gait. Normal strength and tone for age. Extremities: No clubbing, cyanosis or edema.   Neuro: Alert and oriented X 3. Psych:  Good affect, responds appropriately  TELEMETRY:Sinus rhythm  ASSESSMENT AND PLAN: Acute coronary syndrome with positive straight a [facility] showed septal and inferior wall hypokinesis at peak exercise suggestive of multivessel disease. Will do cardiac catheterization this morning.  Principal Problem:   Chest pain    Kipp Shank A, MD, [facility] 05/31/2017 8:06 AM

## 2017-05-31 NOTE — Progress Notes (Signed)
[employer] - [place] [facility]   PATIENT NAME: Phyllis ParodyRhonda Gonzalez    MR#:  [id number]  DATE OF BIRTH:  10/31/1956  SUBJECTIVE:   Patient going for cath this am Reports no chest pain or SOB this am or through the day yesterday  REVIEW OF SYSTEMS:    Review of Systems  Constitutional: Negative for fever, chills weight loss HENT: Negative for ear pain, nosebleeds, congestion, facial swelling, rhinorrhea, neck pain, neck stiffness and ear discharge.   Respiratory: Negative for cough, shortness of breath, wheezing  Cardiovascular: Negative for chest pain currently, palpitations and leg swelling.  Gastrointestinal: Negative for heartburn, abdominal pain, vomiting, diarrhea or consitpation Genitourinary: Negative for dysuria, urgency, frequency, hematuria Musculoskeletal: Negative for back pain or joint pain Neurological: Negative for dizziness, seizures, syncope, focal weakness,  numbness  Hematological: Does not bruise/bleed easily.  Psychiatric/Behavioral: Negative for hallucinations, confusion, dysphoric mood    Tolerating Diet: NPO      DRUG ALLERGIES:   Allergies  Allergen Reactions  . Codeine Nausea Only    VITALS:  Blood pressure 109/64, pulse 66, temperature (!) 97.5 F (36.4 C), temperature source Oral, resp. rate 19, height 5\' 3"  (1.6 m), weight 57.3 kg (126 lb 4.8 oz), SpO2 99 %.  PHYSICAL EXAMINATION:  Constitutional: Appears well-developed and well-nourished. No distress. HENT: Normocephalic. Marland Kitchen. Oropharynx is clear and moist.  Eyes: Conjunctivae and EOM are normal. PERRLA, no scleral icterus.  Neck: Normal ROM. Neck supple. No JVD. No tracheal deviation. CVS: RRR, S1/S2 +, no murmurs, no gallops, no carotid bruit.  Pulmonary: Effort and breath sounds normal, no stridor, rhonchi, wheezes, rales.  Abdominal: Soft. BS +,  no distension, tenderness, rebound or guarding.  Musculoskeletal: Normal range of motion. No edema and no tenderness.   Neuro: Alert. CN 2-12 grossly intact. No focal deficits. Skin: Skin is warm and dry. No rash noted. Psychiatric: Normal mood and affect.      LABORATORY PANEL:   CBC  Recent Labs Lab 05/31/17 0442  WBC 7.4  HGB 13.3  HCT 39.5  PLT 202   ------------------------------------------------------------------------------------------------------------------  Chemistries   Recent Labs Lab 05/31/17 0442  NA 143  K 3.9  CL 107  CO2 29  GLUCOSE 97  BUN 13  CREATININE 0.79  CALCIUM 9.4   ------------------------------------------------------------------------------------------------------------------  Cardiac Enzymes  Recent Labs Lab 05/28/17 1820 05/29/17 0026 05/29/17 0555  TROPONINI <0.03 <0.03 <0.03   ------------------------------------------------------------------------------------------------------------------  RADIOLOGY:  No results found.   ASSESSMENT AND PLAN:   61 year old female with no significant past medical history who presented with chest pain. She had an abnormal echo stress test showing anterior and inferior wall motion abnormalities concerning for multivessel CAD last week.  1. Unstable angina: Patient will undergo cardiac catheterization given abnormal recent stress test  (septal and inferior hypokinesis at peak exercise) today. She has ruled out for acute coronary syndrome with negative troponins. Continue heparin gtt for now Patient prefers morphine over nitroglycerin for recurrent chest pain Continue Aspirin, statin and beta blocker  LDL 119  Echocardiogram shows diastolic dysfunction without wall motion abnormalities.    Management plans discussed with the patient and she is in agreement.  CODE STATUS: full  TOTAL TIME TAKING CARE OF THIS PATIENT: 21 minutes.     POSSIBLE D/C tuesday, DEPENDING ON CLINICAL CONDITION.   Octave Montrose M.D on 05/31/2017 at 6:52 AM  Between 7am to 6pm - Pager - [phone] After 6pm go to  [email or web address] - password [email or web address]  [employer] [facility]  Office  [phone]  CC: Primary care physician; Lafe GarinKallianos, John, MD  Note: This dictation was prepared with Dragon dictation along with smaller phrase technology. Any transcriptional errors that result from this process are unintentional.

## 2017-05-31 NOTE — Progress Notes (Signed)
ANTICOAGULATION CONSULT NOTE - Initial Consult  Pharmacy Consult for Heparin  Indication: chest pain/ACS  Allergies  Allergen Reactions  . Codeine Nausea Only    Patient Measurements: Height: 5\' 3"  (160 cm) Weight: 126 lb 4.8 oz (57.3 kg) IBW/kg (Calculated) : 52.4 Heparin Dosing Weight: 58.1 kg   Vital Signs: Temp: 97.5 F (36.4 C) (07/30 0452) Temp Source: Oral (07/30 0452) BP: 109/64 (07/30 0452) Pulse Rate: 66 (07/30 0452)  Labs:  Recent Labs  05/28/17 1820 05/28/17 2153 05/29/17 0026  05/29/17 0555 05/29/17 1151 05/29/17 1302 05/30/17 0452 05/31/17 0442  HGB 13.9  --   --   --  12.7  --  13.5 13.4 13.3  HCT 40.8  --   --   --  37.9  --  39.6 39.8 39.5  PLT 216  --   --   --  208  --  207 192 202  APTT  --  29  --   --   --   --   --   --   --   LABPROT  --  13.7  --   --   --   --  13.8  --   --   INR  --  1.05  --   --   --   --  1.06  --   --   HEPARINUNFRC  --   --   --   < > 0.44 0.41  --  0.39 0.32  CREATININE 0.72  --   --   --  0.59  --  0.67  --  0.79  TROPONINI <0.03  --  <0.03  --  <0.03  --   --   --   --   < > = values in this interval not displayed.  Estimated Creatinine Clearance: 63.4 mL/min (by C-G formula based on SCr of 0.79 mg/dL).   Medical History: Past Medical History:  Diagnosis Date  . Abnormal stress test   . Kidney stone     Medications:  Prescriptions Prior to Admission  Medication Sig Dispense Refill Last Dose  . estradiol (ESTRACE) 0.1 MG/GM vaginal cream place 1 gram vaginally at bedtime for 2 weeks then DECREASE TO 1 gram ONCE OR TWICE A WEEK  0 unknown at unknown    Assessment: Pharmacy consulted to dose heparin in this 61 year old female admitted with   Goal of Therapy:  Heparin level 0.3-0.7 units/ml Monitor platelets by anticoagulation protocol: Yes   Plan:  Give 3500 units bolus x 1 Start heparin infusion at 700 units/hr Check anti-Xa level in 6 hours and daily while on heparin Continue to monitor H&H  and platelets   7/28 0600 AM heparin level 0.44. Continue current regimen. Recheck in 6 hours to confirm. 7/28: Repeat HL therapeutic. Will continue current regimen. Will recheck in am.   7/29 heparin level 0.39. Continue current regimen. Recheck heparin level and CBC with tomorrow AM labs.  7/30 heparin level 0.32. Continue current regimen. Recheck heparin level and CBC with tomorrow AM labs.   Phyllis Gonzalez 05/31/2017,5:51 AM

## 2017-05-31 NOTE — Plan of Care (Signed)
Problem: Pain Managment: Goal: General experience of comfort will improve Outcome: Progressing RN will given prn pain medication if patient reports any pain.

## 2017-05-31 NOTE — Discharge Summary (Signed)
[facility] [place] [facility]   PATIENT NAME: Phyllis ParodyRhonda Yang    MR#:  5[id number]  DATE OF BIRTH:  10/20/1956  DATE OF ADMISSION:  05/28/2017 ADMITTING PHYSICIAN: Oralia Manisavid Willis, MD  DATE OF DISCHARGE: 05/31/2017  PRIMARY CARE PHYSICIAN: Lafe GarinKallianos, John, MD    ADMISSION DIAGNOSIS:  Secondary hypertension [I15.9] Chest pain, unspecified type [R07.9]  DISCHARGE DIAGNOSIS:  Principal Problem:   Chest pain   SECONDARY DIAGNOSIS:   Past Medical History:  Diagnosis Date  . Abnormal stress test   . Kidney stone     HOSPITAL COURSE:   61 year old female with no significant past medical history who presented with chest pain. She had an abnormal echo stress test showing anterior and inferior wall motion abnormalities concerning for multivessel CAD last week.  1.Chest pain: Patient underwent cardiac catheterization which showed normal arteries and normal ejection fraction. She has mild mitral valve prolapse. Her chest pain is not cardiac related. She has ruled out for acute coronary syndrome with negative troponins. She will continue Aspirin, statin and beta blocker  LDL 119  Echocardiogram shows diastolic dysfunction without wall motion abnormalities   DISCHARGE CONDITIONS AND DIET:   Stable for discharge on cardiac diet  CONSULTS OBTAINED:  Treatment Team:  Laurier NancyKhan, Shaukat A, MD  DRUG ALLERGIES:   Allergies  Allergen Reactions  . Codeine Nausea Only    DISCHARGE MEDICATIONS:   Current Discharge Medication List    START taking these medications   Details  aspirin EC 81 MG tablet Take 1 tablet (81 mg total) by mouth daily. Qty: 30 tablet, Refills: 0    metoprolol tartrate (LOPRESSOR) 25 MG tablet Take 1 tablet (25 mg total) by mouth 2 (two) times daily. Qty: 60 tablet, Refills: 0    rosuvastatin (CRESTOR) 20 MG tablet Take 1 tablet (20 mg total) by mouth at bedtime. Qty: 30 tablet, Refills: 11      STOP taking these medications      estradiol (ESTRACE) 0.1 MG/GM vaginal cream           Today   CHIEF COMPLAINT:  No acute issues overnight   VITAL SIGNS:  Blood pressure 123/78, pulse 71, temperature 98.3 F (36.8 C), temperature source Oral, resp. rate 19, height 5\' 3"  (1.6 m), weight 57.2 kg (126 lb), SpO2 99 %.   REVIEW OF SYSTEMS:  Review of Systems  Constitutional: Negative.  Negative for chills, fever and malaise/fatigue.  HENT: Negative.  Negative for ear discharge, ear pain, hearing loss, nosebleeds and sore throat.   Eyes: Negative.  Negative for blurred vision and pain.  Respiratory: Negative.  Negative for cough, hemoptysis, shortness of breath and wheezing.   Cardiovascular: Negative.  Negative for chest pain, palpitations and leg swelling.  Gastrointestinal: Negative.  Negative for abdominal pain, blood in stool, diarrhea, nausea and vomiting.  Genitourinary: Negative.  Negative for dysuria.  Musculoskeletal: Negative.  Negative for back pain.  Skin: Negative.   Neurological: Negative for dizziness, tremors, speech change, focal weakness, seizures and headaches.  Endo/Heme/Allergies: Negative.  Does not bruise/bleed easily.  Psychiatric/Behavioral: Negative.  Negative for depression, hallucinations and suicidal ideas.     PHYSICAL EXAMINATION:  GENERAL:  61 y.o.-year-old patient lying in the bed with no acute distress.  NECK:  Supple, no jugular venous distention. No thyroid enlargement, no tenderness.  LUNGS: Normal breath sounds bilaterally, no wheezing, rales,rhonchi  No use of accessory muscles of respiration.  CARDIOVASCULAR: S1, S2 normal. No murmurs, rubs, or gallops.  ABDOMEN: Soft, non-tender,  non-distended. Bowel sounds present. No organomegaly or mass.  EXTREMITIES: No pedal edema, cyanosis, or clubbing.  PSYCHIATRIC: The patient is alert and oriented x 3.  SKIN: No obvious rash, lesion, or ulcer.   DATA REVIEW:   CBC  Recent Labs Lab 05/31/17 0442  WBC 7.4  HGB 13.3   HCT 39.5  PLT 202    Chemistries   Recent Labs Lab 05/31/17 0442  NA 143  K 3.9  CL 107  CO2 29  GLUCOSE 97  BUN 13  CREATININE 0.79  CALCIUM 9.4    Cardiac Enzymes  Recent Labs Lab 05/28/17 1820 05/29/17 0026 05/29/17 0555  TROPONINI <0.03 <0.03 <0.03    Microbiology Results  @MICRORSLT48 @  RADIOLOGY:  No results found.    Current Discharge Medication List    START taking these medications   Details  aspirin EC 81 MG tablet Take 1 tablet (81 mg total) by mouth daily. Qty: 30 tablet, Refills: 0    metoprolol tartrate (LOPRESSOR) 25 MG tablet Take 1 tablet (25 mg total) by mouth 2 (two) times daily. Qty: 60 tablet, Refills: 0    rosuvastatin (CRESTOR) 20 MG tablet Take 1 tablet (20 mg total) by mouth at bedtime. Qty: 30 tablet, Refills: 11      STOP taking these medications     estradiol (ESTRACE) 0.1 MG/GM vaginal cream            Management plans discussed with the patient and she is in agreement. Stable for discharge home  Patient should follow up with dr Welton Flakeskhan  CODE STATUS:     Code Status Orders        Start     Ordered   05/28/17 2259  Full code  Continuous     05/28/17 2258    Code Status History    Date Active Date Inactive Code Status Order ID Comments User Context   This patient has a current code status but no historical code status.    Advance Directive Documentation     Most Recent Value  Type of Advance Directive  Living will  Pre-existing out of facility DNR order (yellow form or pink MOST form)  -  "MOST" Form in Place?  -      TOTAL TIME TAKING CARE OF THIS PATIENT: 37 minutes.    Note: This dictation was prepared with Dragon dictation along with smaller phrase technology. Any transcriptional errors that result from this process are unintentional.  Adreana Coull M.D on 05/31/2017 at 9:56 AM  Between 7am to 6pm - Pager - [phone] After 6pm go to [email or web address] - Social research officer, governmentpassword [email or web address] [email or web address]  [facility]  Office  [phone]  CC: Primary care physician; Lafe GarinKallianos, John, MD

## 2017-05-31 NOTE — Plan of Care (Signed)
Problem: Pain Managment: Goal: General experience of comfort will improve Outcome: Progressing No voiced complaints of chest pain.  Slept after Ambien given as requested.

## 2017-06-02 NOTE — Addendum Note (Signed)
Addended by: Billie LadeNOVAK, Nirali Magouirk A on: 06/02/2017 02:41 PM   Modules accepted: Orders

## 2017-12-28 IMAGING — CR DG CHEST 2V
1 series · 2 of 2 positions shown · non-contrast
Comparison: None.

CLINICAL DATA: Left chest pain radiating to the back. Left hand
numbness intermittently for the past 2 weeks.

EXAM:
CHEST  2 VIEW

[Series 1: dg chest 2 view · 0.14mm/px · 2 of 2 slices shown]
[im 1/2]
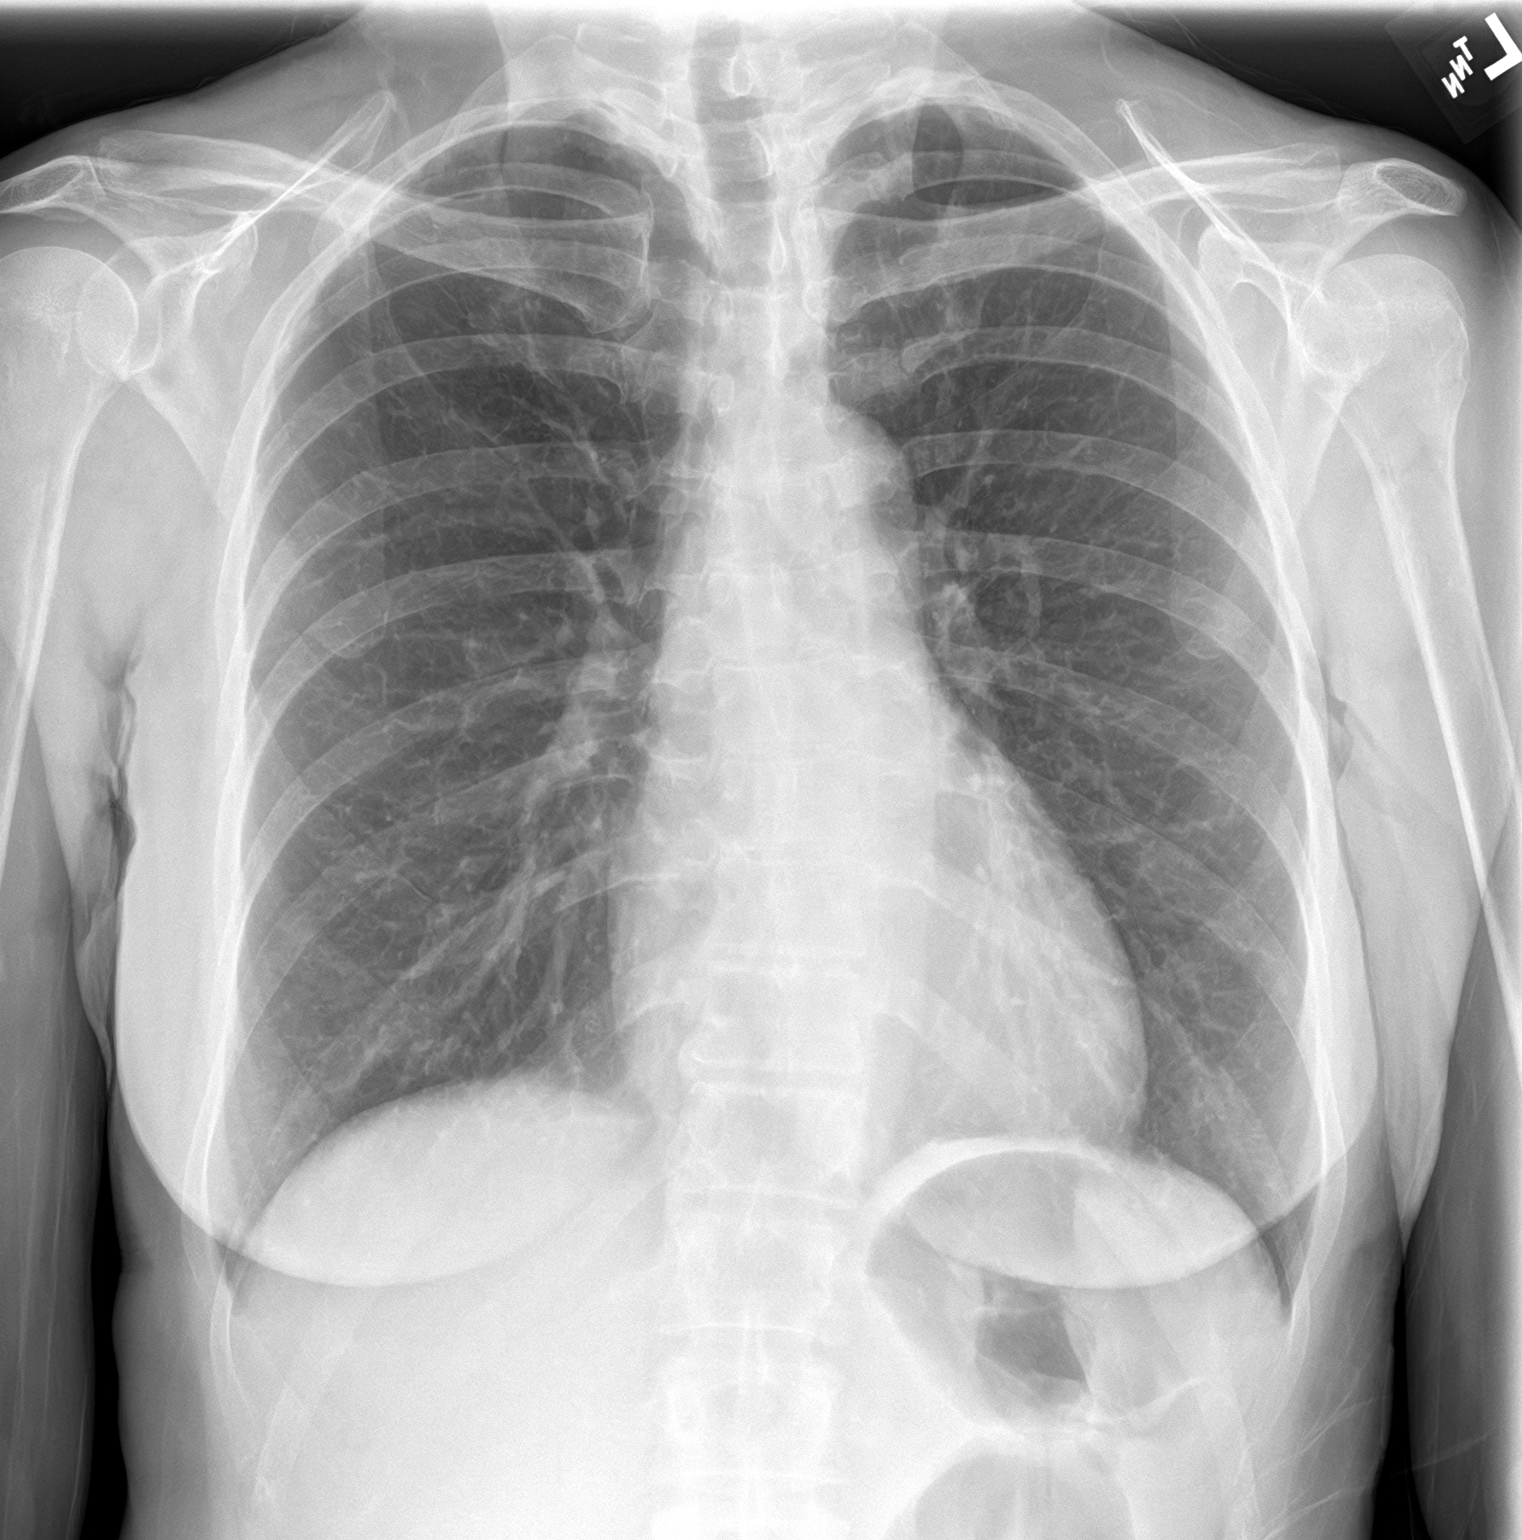
[im 2/2]
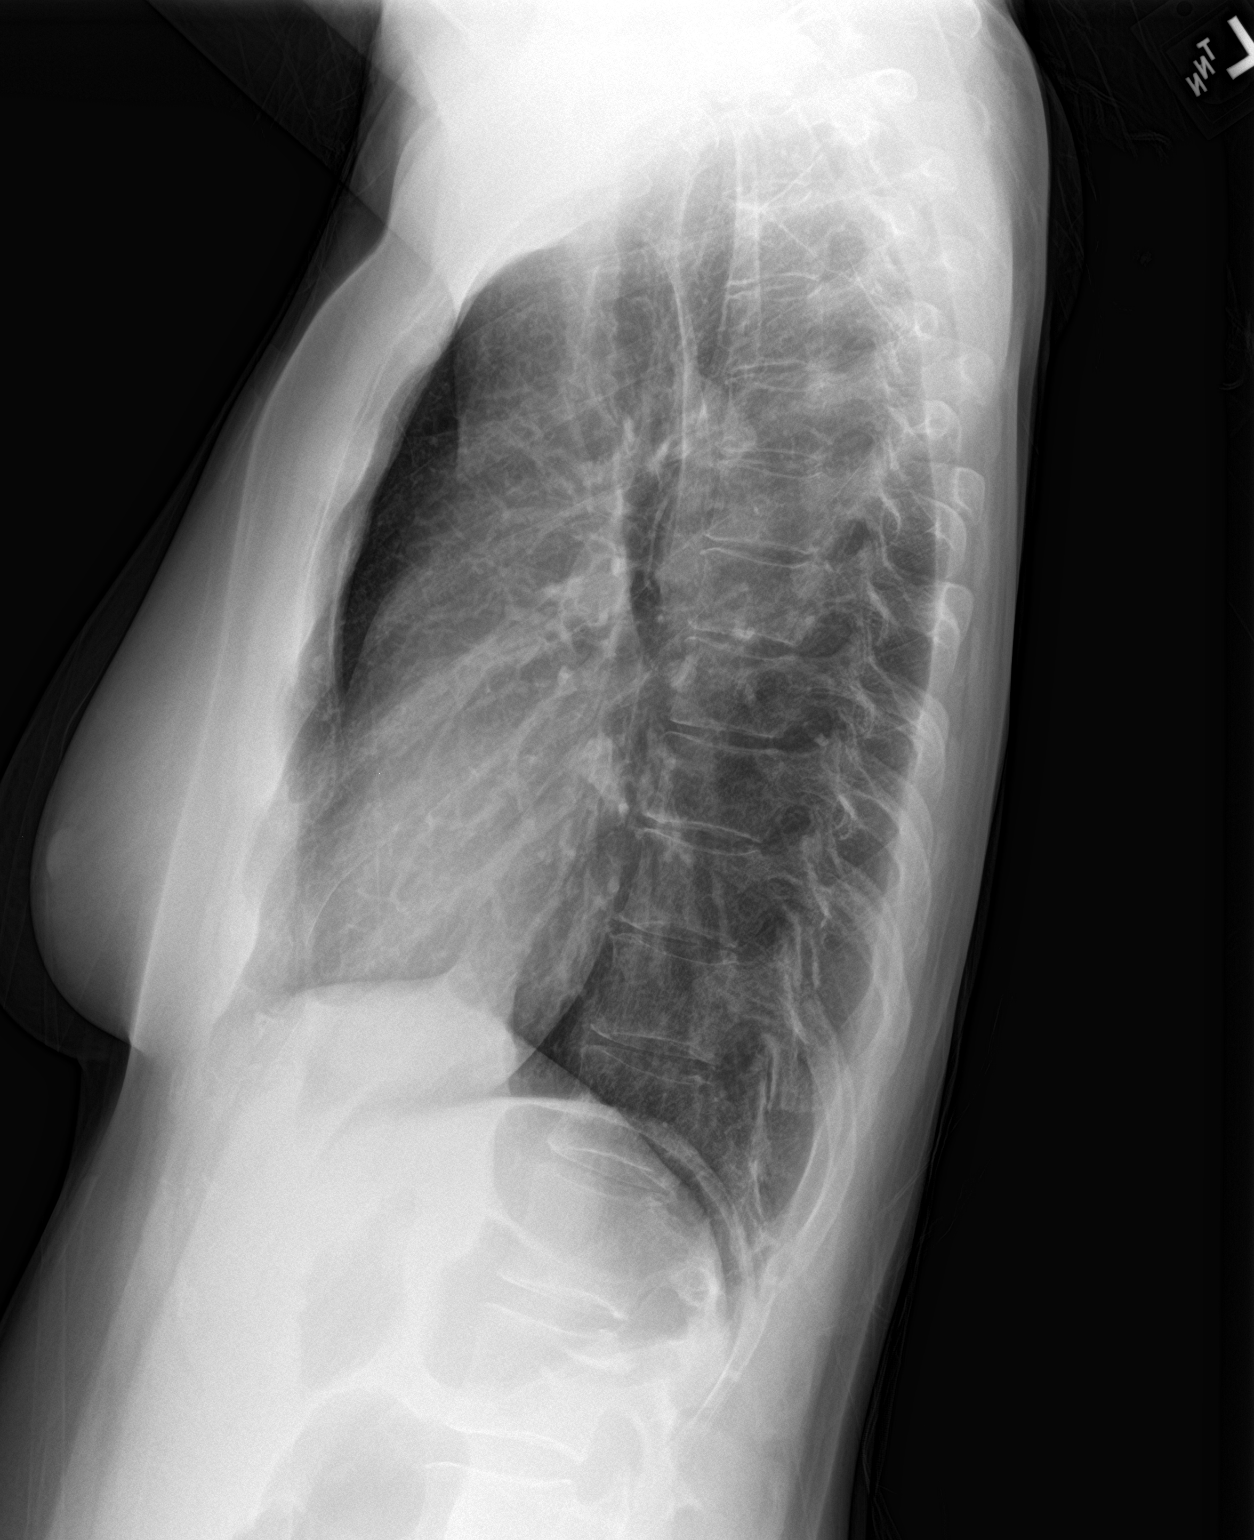

[2 of 2 positions shown; findings below may reference images not displayed]

FINDINGS: Normal sized heart. Clear lungs with normal vascularity. Minimal
scoliosis and minimal thoracic spine degenerative changes.
IMPRESSION: No acute abnormality.

## 2018-07-20 ENCOUNTER — Ambulatory Visit: Payer: Self-pay | Admitting: Adult Health

## 2018-07-20 ENCOUNTER — Encounter: Payer: Self-pay | Admitting: Adult Health

## 2018-07-20 VITALS — BP 144/92 | HR 64 | Temp 97.8°F | Resp 16 | Ht 63.0 in | Wt 135.0 lb

## 2018-07-20 DIAGNOSIS — W101XXA Fall (on)(from) sidewalk curb, initial encounter: Secondary | ICD-10-CM

## 2018-07-20 DIAGNOSIS — T148XXA Other injury of unspecified body region, initial encounter: Secondary | ICD-10-CM

## 2018-07-20 NOTE — Patient Instructions (Signed)
Abrasion An abrasion is a cut or scrape on the surface of your skin. An abrasion does not go through all of the layers of your skin. It is important to take good care of your abrasion to prevent infection. Follow these instructions at home: Medicines  Take or apply medicines only as told by your doctor.  If you were prescribed an antibiotic ointment, finish all of it even if you start to feel better. Wound care  Clean the wound with mild soap and water 2-3 times per day or as told by your doctor. Pat your wound dry with a clean towel. Do not rub it.  There are many ways to close and cover a wound. Follow instructions from your doctor about: ? How to take care of your wound. ? When and how you should change your bandage (dressing). ? When and how you should take off your dressing.  Check your wound every day for signs of infection. Watch for: ? Redness, swelling, or pain. ? Fluid, blood, or pus. General instructions  Keep the dressing dry as told by your doctor. Do not take baths, swim, use a hot tub, or do anything that would put your wound underwater until your doctor says it is okay.  If there is swelling, raise (elevate) the injured area above the level of your heart while you are sitting or lying down.  Keep all follow-up visits as told by your doctor. This is important. Contact a doctor if:  You were given a tetanus shot and you have any of these where the needle went in: ? Swelling. ? Very bad pain. ? Redness. ? Bleeding.  Medicine does not help your pain.  You have any of these at the site of the wound: ? More redness. ? More swelling. ? More pain. Get help right away if:  You have a red streak going away from your wound.  You have a fever.  You have fluid, blood, or pus coming from your wound.  There is a bad smell coming from your wound. This information is not intended to replace advice given to you by your health care provider. Make sure you discuss any  questions you have with your health care provider. Document Released: 04/06/2008 Document Revised: 03/26/2016 Document Reviewed: 10/17/2014 Elsevier Interactive Patient Education  2018 Elsevier Inc.  

## 2018-07-20 NOTE — Progress Notes (Signed)
Subjective:     Patient ID: Phyllis Gonzalez, female   DOB: November 04, 1955, 62 y.o.   MRN: [id number]  HPI   Blood pressure (!) 144/92, pulse 64, temperature 97.8 F (36.6 C), resp. rate 16, height 5\' 3"  (1.6 m), weight 135 lb (61.2 kg), SpO2 100 %.   Patient is a 62 year old female who comes to the clinic in no acute distress who comes for evaluation after she fell on Saturday  07/16/18 after tripping in [place] on side walk. Denies any head injury. No loss of consciousness.  She reports scrapping side of left hand and knees scraped with fall.   Denies any bone pain or loss of motion. Denies any edema., Denies any chest pain or shortnes of breath at time of fall or currently. Denies FOOSH fall  Patient  denies any fever, body aches,chills, rash, chest pain, shortness of breath, nausea, vomiting, or diarrhea.     Allergies  Allergen Reactions  . Codeine Nausea Only     Patient Active Problem List   Diagnosis Date Noted  . Chest pain 05/28/2017  . Kidney stones 05/18/2017  . 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  metoprolol tartrate (LOPRESSOR) 25 MG tablet, Take 1 tablet (25 mg total) by mouth 2 (two) times daily., Disp: 60 tablet, Rfl: 0 .  rosuvastatin (CRESTOR) 20 MG tablet, Take 1 tablet (20 mg total) by mouth at bedtime., Disp: 30 tablet, Rfl: 0 .  VAGIFEM 10 MCG TABS vaginal tablet, , Disp: , Rfl: 4     Review of Systems  Constitutional: Negative.   HENT: Negative.   Eyes: Negative.   Respiratory: Negative.   Cardiovascular: Negative.        History of mild to moderate MVP - per last echocardiogram   Genitourinary: Negative.   Musculoskeletal: Negative.   Skin: Positive for wound (small bruise left and right knee/ band aid to left hand ). Negative for color change, pallor and rash.  Neurological: Negative for dizziness, tremors, seizures, syncope, facial asymmetry, speech difficulty, weakness, light-headedness, numbness and headaches.    Hematological: Negative.   Psychiatric/Behavioral: Negative.        Objective:   Physical Exam  Constitutional: She is oriented to person, place, and time. She appears well-developed and well-nourished. No distress.  HENT:  Head: Normocephalic and atraumatic.  Nose: Nose normal.  Mouth/Throat: Oropharynx is clear and moist. No oropharyngeal exudate.  Eyes: Pupils are equal, round, and reactive to light. Conjunctivae and EOM are normal.  Neck: Normal range of motion. Neck supple. No JVD present.  Cardiovascular: Normal rate, regular rhythm, normal heart sounds and intact distal pulses. Exam reveals no gallop and no friction rub.  No murmur heard. Pulmonary/Chest: Effort normal and breath sounds normal. No stridor. No respiratory distress. She has no wheezes. She has no rales. She exhibits no tenderness.  Abdominal: Soft.  Musculoskeletal:       Right shoulder: Normal.       Left shoulder: Normal.       Right elbow: Normal.      Right wrist: Normal.       Left wrist: Normal.       Right hip: Normal.       Left hip: Normal.       Right knee: She exhibits ecchymosis (small light blue bruise right knee healing/ no edema/ normal skin temperature / 0.4mm abrasion healed over - no erythema or drainage. ). She exhibits normal range of motion, no  swelling, no effusion, no deformity, no laceration, no erythema, normal alignment, no LCL laxity, normal patellar mobility, no bony tenderness, normal meniscus and no MCL laxity. No tenderness found. No medial joint line, no lateral joint line, no MCL, no LCL and no patellar tendon tenderness noted.       Left knee: She exhibits ecchymosis (small yellow bruise healiing - abrasion less than 0.3 mm healed over no erythema no drainage  /  no edema./ SKIN TEMPERATURE NORMAL ). She exhibits normal range of motion, no swelling, no effusion, no deformity, no laceration, no erythema, normal alignment, no LCL laxity, normal patellar mobility, no bony tenderness,  normal meniscus and no MCL laxity. No tenderness found. No medial joint line, no lateral joint line, no MCL, no LCL and no patellar tendon tenderness noted.       Right ankle: Normal.       Left ankle: Normal.       Cervical back: Normal.       Thoracic back: Normal.       Lumbar back: Normal.       Left upper arm: Normal.       Right forearm: Normal.       Left forearm: Normal.       Right hand: Normal. She exhibits normal range of motion, no tenderness, no bony tenderness, normal two-point discrimination, normal capillary refill, no deformity, no laceration and no swelling. Normal sensation noted. Decreased sensation is not present in the ulnar distribution, is not present in the medial distribution and is not present in the radial distribution. Normal strength noted. She exhibits no finger abduction, no thumb/finger opposition and no wrist extension trouble.       Left hand: Normal.       Hands:      Right upper leg: Normal.       Left upper leg: Normal.       Right lower leg: Normal.       Left lower leg: Normal.       Legs:      Right foot: Normal.       Left foot: Normal.  Left hand abrasion approximately 0.5cm in size / mild erythema in center/ scab formed surrounding diameter/ no spreading erythema. No change in skin temperature   Area cleaned with betadine and triple antibiotic sample pack applied dime sized amount. Gauze dressing applied with coban dressing on top.   Neurological: She is alert and oriented to person, place, and time. She displays normal reflexes. No cranial nerve deficit or sensory deficit. She exhibits normal muscle tone. Coordination normal.  Skin: Skin is warm. Capillary refill takes less than 2 seconds. No rash noted. She is not diaphoretic. There is erythema. No pallor.  Psychiatric: She has a normal mood and affect. Her behavior is normal. Judgment and thought content normal.  Vitals reviewed.  Patient is alert and oriented and responsive to questions  Engages in eye contact with provider. Speaks in full sentences without any pauses without any shortness of breath or distress.  Patient moves on and off of exam table and in room without difficulty. Gait is normal in hall and in room. Patient is oriented to person place time and situation. Patient answers questions appropriately and engages in conversation.     Assessment:     Abrasion  Fall (on)(from) sidewalk curb, initial encounter      Plan:    Discussed wound care instructions keeping abrasion clean and dry. Clean with warm soapy water,  apply triple antibiotic cream and return to clinic if any symptoms of infection occur such as spreading redness, warmth or drainage.   Call cardiologist today   for follow up with elevated blood pressure - patient reported she is not taking medication as prescribed. - Metoprolol.   Discussed returning to clinic or to Lafe Garin, MD PCP for any change in symptoms any pain or loss or change in motion. Patient declined recommended x ray bilateral hands given fall.  Tylenol per package instructions for any pain.   Advised patient call the office or your primary care doctor for an appointment if no improvement within 72 hours or if any symptoms change or worsen at any time  Advised ER or urgent Care if after hours or on weekend. Call 911 for emergency symptoms at any time.Patinet verbalized understanding of all instructions given/reviewed and treatment plan and has no further questions or concerns at this time.    Patient verbalized understanding of all instructions given and denies any further questions at this time.

## 2019-01-13 ENCOUNTER — Encounter: Payer: Self-pay | Admitting: Nurse Practitioner

## 2019-01-13 ENCOUNTER — Other Ambulatory Visit: Payer: Self-pay

## 2019-01-13 ENCOUNTER — Ambulatory Visit: Payer: Self-pay | Admitting: Nurse Practitioner

## 2019-01-13 VITALS — BP 130/91 | HR 83 | Temp 97.8°F | Resp 16 | Ht 63.0 in | Wt 138.0 lb

## 2019-01-13 DIAGNOSIS — L570 Actinic keratosis: Secondary | ICD-10-CM

## 2019-01-13 NOTE — Progress Notes (Signed)
   Subjective:    Patient ID: Natonia Tuck, female    DOB: [birth year], 63 y.o.   MRN: [id number]  HPI Synethia [facility] today with c/p dry crusty lesion to right upper arm x 2 weeks. She is really unsure of how long the lesion has been there, but reports she noticed it around 2-3 weeks ago. She denies any exposure, possible bites or injury, itching, burning or drainage. Reports has applied alcohol a few times with no change and only hurts if she squeezes it. No change in size.   Denies FH of skin cancer or issues.   Review of Systems  Constitutional: Negative for fatigue and fever.  Skin: Positive for color change.       Objective:   Physical Exam Constitutional:      Appearance: Normal appearance.  Neck:     Musculoskeletal: Neck supple.  Skin:    General: Skin is warm and dry.     Comments: Right upper lateral arm lesion that is raised, papular with some scaling or crustations to medial aspect. No tenderness, drainage or marked erythema noted.   Neurological:     Mental Status: She is alert.  Psychiatric:        Mood and Affect: Mood normal.           Assessment & Plan:

## 2019-01-13 NOTE — Patient Instructions (Signed)
Nice meeting you today Taneya!  Please don't press or squeeze on the lesion on your arm, no need to apply anything such as alcohole  Please follow up with your dermatologist for further eval./treat  Encouraged patient to call the office or primary care doctor for an appointment if no improvement in symptoms or if symptoms change or worsen after 72 hours of planned treatment. Patient verbalized understanding of all instructions given/reviewed and has no further questions or concerns at this time.

## 2019-01-18 ENCOUNTER — Encounter: Payer: Self-pay | Admitting: Medical

## 2019-01-18 ENCOUNTER — Ambulatory Visit: Payer: Self-pay | Admitting: Medical

## 2019-01-18 ENCOUNTER — Other Ambulatory Visit: Payer: Self-pay

## 2019-01-18 VITALS — BP 146/95 | HR 78 | Temp 97.8°F | Resp 20 | Ht 63.0 in | Wt 136.0 lb

## 2019-01-18 DIAGNOSIS — Z0130 Encounter for examination of blood pressure without abnormal findings: Secondary | ICD-10-CM

## 2019-01-18 NOTE — Progress Notes (Signed)
Subjective:    Patient ID: Phyllis Gonzalez, female    DOB: Feb 26, 1956, 63 y.o.   MRN: [id number]  HPI  63 yo in non acute distress. Recheck  Of blood pressure  Was on blood pressure medication , stopped on her own in October because she felt her blood pressure was fine.   Sees cardiology  Dr. Rudene Christians at [facility]. Patient  MVP syndrome.  Able to work from home.Works at [employer].  July 2020 scheduled for Echocardiagram Blood pressure (!) 146/95, pulse 78, temperature 97.8 F (36.6 C), resp. rate 20, height 5\' 3"  (1.6 m), weight 136 lb (61.7 kg), SpO2 100 %. Allergies  Allergen Reactions  . Codeine Nausea Only     Review of Systems  Constitutional: Negative for chills, fatigue and fever.  HENT: Positive for sinus pressure (maxillary area and the temple area on /off pain.) and sore throat (on and off last 2-3 days). Negative for ear discharge and ear pain.   Eyes: Negative for discharge and itching.  Respiratory: Negative for cough and shortness of breath.   Cardiovascular: Negative for chest pain.  Gastrointestinal: Negative for abdominal pain.  Genitourinary: Negative for dysuria.  Musculoskeletal: Negative for myalgias.  Skin: Negative for rash.  Allergic/Immunologic: Negative for environmental allergies and food allergies.  Neurological: Positive for headaches (temporal  area headaches appearing  every other day. 2-3/10 , takes nothing for  the Headache.  Headache comes and go on the same day. ). Negative for dizziness, syncope and light-headedness.  Hematological: Negative for adenopathy.  Psychiatric/Behavioral: Negative for behavioral problems, self-injury and suicidal ideas. The patient is nervous/anxious (Covid-19).    Father history of HTN    Objective:   Physical Exam Vitals signs and nursing note reviewed.  Constitutional:      Appearance: Normal appearance. She is normal weight.  HENT:     Head: Normocephalic and atraumatic.   Jaw: There is normal jaw occlusion.     Right Ear: Hearing, ear canal and external ear normal. A middle ear effusion is present.     Left Ear: Hearing, ear canal and external ear normal. A middle ear effusion is present.     Nose: Nose normal. No mucosal edema, congestion or rhinorrhea.     Right Nostril: No foreign body, epistaxis, septal hematoma or occlusion.     Left Nostril: No foreign body, epistaxis, septal hematoma or occlusion.     Right Turbinates: Not enlarged, swollen or pale.     Left Turbinates: Not enlarged, swollen or pale.     Mouth/Throat:     Lips: Pink.     Mouth: Mucous membranes are moist.     Pharynx: Oropharynx is clear. No pharyngeal swelling, oropharyngeal exudate, posterior oropharyngeal erythema or uvula swelling.     Tonsils: Swelling: 2+ on the right. 2+ on the left.  Eyes:     Extraocular Movements: Extraocular movements intact.     Conjunctiva/sclera: Conjunctivae normal.     Pupils: Pupils are equal, round, and reactive to light.  Neck:     Musculoskeletal: Normal range of motion.  Cardiovascular:     Rate and Rhythm: Normal rate and regular rhythm.     Pulses: Normal pulses.     Heart sounds: Normal heart sounds.  Pulmonary:     Effort: Pulmonary effort is normal.     Breath sounds: Normal breath sounds.  Skin:    General: Skin is warm and dry.  Neurological:     General: No focal  deficit present.     Mental Status: She is alert and oriented to person, place, and time.  Psychiatric:        Mood and Affect: Mood normal.        Behavior: Behavior normal.        Thought Content: Thought content normal.        Judgment: Judgment normal.           Assessment & Plan:  Blood pressure check. Follow up with  Cardiologist on Friday. Offered for patient to return with her  BP machine for training, she feels unsure she is taking it correctly however her machine today red 141/91.

## 2019-01-18 NOTE — Patient Instructions (Signed)
DASH Eating Plan DASH stands for "Dietary Approaches to Stop Hypertension." The DASH eating plan is a healthy eating plan that has been shown to reduce high blood pressure (hypertension). It may also reduce your risk for type 2 diabetes, heart disease, and stroke. The DASH eating plan may also help with weight loss. What are tips for following this plan?  General guidelines  Avoid eating more than 2,300 mg (milligrams) of salt (sodium) a day. If you have hypertension, you may need to reduce your sodium intake to 1,500 mg a day.  Limit alcohol intake to no more than 1 drink a day for nonpregnant women and 2 drinks a day for men. One drink equals 12 oz of beer, 5 oz of wine, or 1 oz of hard liquor.  Work with your health care provider to maintain a healthy body weight or to lose weight. Ask what an ideal weight is for you.  Get at least 30 minutes of exercise that causes your heart to beat faster (aerobic exercise) most days of the week. Activities may include walking, swimming, or biking.  Work with your health care provider or diet and nutrition specialist (dietitian) to adjust your eating plan to your individual calorie needs. Reading food labels   Check food labels for the amount of sodium per serving. Choose foods with less than 5 percent of the Daily Value of sodium. Generally, foods with less than 300 mg of sodium per serving fit into this eating plan.  To find whole grains, look for the word "whole" as the first word in the ingredient list. Shopping  Buy products labeled as "low-sodium" or "no salt added."  Buy fresh foods. Avoid canned foods and premade or frozen meals. Cooking  Avoid adding salt when cooking. Use salt-free seasonings or herbs instead of table salt or sea salt. Check with your health care provider or pharmacist before using salt substitutes.  Do not fry foods. Cook foods using healthy methods such as baking, boiling, grilling, and broiling instead.  Cook with  heart-healthy oils, such as olive, canola, soybean, or sunflower oil. Meal planning  Eat a balanced diet that includes: ? 5 or more servings of fruits and vegetables each day. At each meal, try to fill half of your plate with fruits and vegetables. ? Up to 6-8 servings of whole grains each day. ? Less than 6 oz of lean meat, poultry, or fish each day. A 3-oz serving of meat is about the same size as a deck of cards. One egg equals 1 oz. ? 2 servings of low-fat dairy each day. ? A serving of nuts, seeds, or beans 5 times each week. ? Heart-healthy fats. Healthy fats called Omega-3 fatty acids are found in foods such as flaxseeds and coldwater fish, like sardines, salmon, and mackerel.  Limit how much you eat of the following: ? Canned or prepackaged foods. ? Food that is high in trans fat, such as fried foods. ? Food that is high in saturated fat, such as fatty meat. ? Sweets, desserts, sugary drinks, and other foods with added sugar. ? Full-fat dairy products.  Do not salt foods before eating.  Try to eat at least 2 vegetarian meals each week.  Eat more home-cooked food and less restaurant, buffet, and fast food.  When eating at a restaurant, ask that your food be prepared with less salt or no salt, if possible. What foods are recommended? The items listed may not be a complete list. Talk with your dietitian about   what dietary choices are best for you. Grains Whole-grain or whole-wheat bread. Whole-grain or whole-wheat pasta. Brown rice. Oatmeal. Quinoa. Bulgur. Whole-grain and low-sodium cereals. Pita bread. Low-fat, low-sodium crackers. Whole-wheat flour tortillas. Vegetables Fresh or frozen vegetables (raw, steamed, roasted, or grilled). Low-sodium or reduced-sodium tomato and vegetable juice. Low-sodium or reduced-sodium tomato sauce and tomato paste. Low-sodium or reduced-sodium canned vegetables. Fruits All fresh, dried, or frozen fruit. Canned fruit in natural juice (without  added sugar). Meat and other protein foods Skinless chicken or turkey. Ground chicken or turkey. Pork with fat trimmed off. Fish and seafood. Egg whites. Dried beans, peas, or lentils. Unsalted nuts, nut butters, and seeds. Unsalted canned beans. Lean cuts of beef with fat trimmed off. Low-sodium, lean deli meat. Dairy Low-fat (1%) or fat-free (skim) milk. Fat-free, low-fat, or reduced-fat cheeses. Nonfat, low-sodium ricotta or cottage cheese. Low-fat or nonfat yogurt. Low-fat, low-sodium cheese. Fats and oils Soft margarine without trans fats. Vegetable oil. Low-fat, reduced-fat, or light mayonnaise and salad dressings (reduced-sodium). Canola, safflower, olive, soybean, and sunflower oils. Avocado. Seasoning and other foods Herbs. Spices. Seasoning mixes without salt. Unsalted popcorn and pretzels. Fat-free sweets. What foods are not recommended? The items listed may not be a complete list. Talk with your dietitian about what dietary choices are best for you. Grains Baked goods made with fat, such as croissants, muffins, or some breads. Dry pasta or rice meal packs. Vegetables Creamed or fried vegetables. Vegetables in a cheese sauce. Regular canned vegetables (not low-sodium or reduced-sodium). Regular canned tomato sauce and paste (not low-sodium or reduced-sodium). Regular tomato and vegetable juice (not low-sodium or reduced-sodium). Pickles. Olives. Fruits Canned fruit in a light or heavy syrup. Fried fruit. Fruit in cream or butter sauce. Meat and other protein foods Fatty cuts of meat. Ribs. Fried meat. Bacon. Sausage. Bologna and other processed lunch meats. Salami. Fatback. Hotdogs. Bratwurst. Salted nuts and seeds. Canned beans with added salt. Canned or smoked fish. Whole eggs or egg yolks. Chicken or turkey with skin. Dairy Whole or 2% milk, cream, and half-and-half. Whole or full-fat cream cheese. Whole-fat or sweetened yogurt. Full-fat cheese. Nondairy creamers. Whipped toppings.  Processed cheese and cheese spreads. Fats and oils Butter. Stick margarine. Lard. Shortening. Ghee. Bacon fat. Tropical oils, such as coconut, palm kernel, or palm oil. Seasoning and other foods Salted popcorn and pretzels. Onion salt, garlic salt, seasoned salt, table salt, and sea salt. Worcestershire sauce. Tartar sauce. Barbecue sauce. Teriyaki sauce. Soy sauce, including reduced-sodium. Steak sauce. Canned and packaged gravies. Fish sauce. Oyster sauce. Cocktail sauce. Horseradish that you find on the shelf. Ketchup. Mustard. Meat flavorings and tenderizers. Bouillon cubes. Hot sauce and Tabasco sauce. Premade or packaged marinades. Premade or packaged taco seasonings. Relishes. Regular salad dressings. Where to find more information:  National Heart, Lung, and Blood Institute: www.nhlbi.nih.gov  American Heart Association: www.heart.org Summary  The DASH eating plan is a healthy eating plan that has been shown to reduce high blood pressure (hypertension). It may also reduce your risk for type 2 diabetes, heart disease, and stroke.  With the DASH eating plan, you should limit salt (sodium) intake to 2,300 mg a day. If you have hypertension, you may need to reduce your sodium intake to 1,500 mg a day.  When on the DASH eating plan, aim to eat more fresh fruits and vegetables, whole grains, lean proteins, low-fat dairy, and heart-healthy fats.  Work with your health care provider or diet and nutrition specialist (dietitian) to adjust your eating plan to your   individual calorie needs. This information is not intended to replace advice given to you by your health care provider. Make sure you discuss any questions you have with your health care provider. Document Released: 10/08/2011 Document Revised: 10/12/2016 Document Reviewed: 10/12/2016 Elsevier Interactive Patient Education  2019 Elsevier Inc.  Managing Your Hypertension Hypertension is commonly called high blood pressure. This is when  the force of your blood pressing against the walls of your arteries is too strong. Arteries are blood vessels that carry blood from your heart throughout your body. Hypertension forces the heart to work harder to pump blood, and may cause the arteries to become narrow or stiff. Having untreated or uncontrolled hypertension can cause heart attack, stroke, kidney disease, and other problems. What are blood pressure readings? A blood pressure reading consists of a higher number over a lower number. Ideally, your blood pressure should be below 120/80. The first ("top") number is called the systolic pressure. It is a measure of the pressure in your arteries as your heart beats. The second ("bottom") number is called the diastolic pressure. It is a measure of the pressure in your arteries as the heart relaxes. What does my blood pressure reading mean? Blood pressure is classified into four stages. Based on your blood pressure reading, your health care provider may use the following stages to determine what type of treatment you need, if any. Systolic pressure and diastolic pressure are measured in a unit called mm Hg. Normal  Systolic pressure: below 120.  Diastolic pressure: below 80. Elevated  Systolic pressure: 120-129.  Diastolic pressure: below 80. Hypertension stage 1  Systolic pressure: 130-139.  Diastolic pressure: 80-89. Hypertension stage 2  Systolic pressure: 140 or above.  Diastolic pressure: 90 or above. What health risks are associated with hypertension? Managing your hypertension is an important responsibility. Uncontrolled hypertension can lead to:  A heart attack.  A stroke.  A weakened blood vessel (aneurysm).  Heart failure.  Kidney damage.  Eye damage.  Metabolic syndrome.  Memory and concentration problems. What changes can I make to manage my hypertension? Hypertension can be managed by making lifestyle changes and possibly by taking medicines. Your health  care provider will help you make a plan to bring your blood pressure within a normal range. Eating and drinking   Eat a diet that is high in fiber and potassium, and low in salt (sodium), added sugar, and fat. An example eating plan is called the DASH (Dietary Approaches to Stop Hypertension) diet. To eat this way: ? Eat plenty of fresh fruits and vegetables. Try to fill half of your plate at each meal with fruits and vegetables. ? Eat whole grains, such as whole wheat pasta, brown rice, or whole grain bread. Fill about one quarter of your plate with whole grains. ? Eat low-fat diary products. ? Avoid fatty cuts of meat, processed or cured meats, and poultry with skin. Fill about one quarter of your plate with lean proteins such as fish, chicken without skin, beans, eggs, and tofu. ? Avoid premade and processed foods. These tend to be higher in sodium, added sugar, and fat.  Reduce your daily sodium intake. Most people with hypertension should eat less than 1,500 mg of sodium a day.  Limit alcohol intake to no more than 1 drink a day for nonpregnant women and 2 drinks a day for men. One drink equals 12 oz of beer, 5 oz of wine, or 1 oz of hard liquor. Lifestyle  Work with your health care   provider to maintain a healthy body weight, or to lose weight. Ask what an ideal weight is for you.  Get at least 30 minutes of exercise that causes your heart to beat faster (aerobic exercise) most days of the week. Activities may include walking, swimming, or biking.  Include exercise to strengthen your muscles (resistance exercise), such as weight lifting, as part of your weekly exercise routine. Try to do these types of exercises for 30 minutes at least 3 days a week.  Do not use any products that contain nicotine or tobacco, such as cigarettes and e-cigarettes. If you need help quitting, ask your health care provider.  Control any long-term (chronic) conditions you have, such as high cholesterol or  diabetes. Monitoring  Monitor your blood pressure at home as told by your health care provider. Your personal target blood pressure may vary depending on your medical conditions, your age, and other factors.  Have your blood pressure checked regularly, as often as told by your health care provider. Working with your health care provider  Review all the medicines you take with your health care provider because there may be side effects or interactions.  Talk with your health care provider about your diet, exercise habits, and other lifestyle factors that may be contributing to hypertension.  Visit your health care provider regularly. Your health care provider can help you create and adjust your plan for managing hypertension. Will I need medicine to control my blood pressure? Your health care provider may prescribe medicine if lifestyle changes are not enough to get your blood pressure under control, and if:  Your systolic blood pressure is 130 or higher.  Your diastolic blood pressure is 80 or higher. Take medicines only as told by your health care provider. Follow the directions carefully. Blood pressure medicines must be taken as prescribed. The medicine does not work as well when you skip doses. Skipping doses also puts you at risk for problems. Contact a health care provider if:  You think you are having a reaction to medicines you have taken.  You have repeated (recurrent) headaches.  You feel dizzy.  You have swelling in your ankles.  You have trouble with your vision. Get help right away if:  You develop a severe headache or confusion.  You have unusual weakness or numbness, or you feel faint.  You have severe pain in your chest or abdomen.  You vomit repeatedly.  You have trouble breathing. Summary  Hypertension is when the force of blood pumping through your arteries is too strong. If this condition is not controlled, it may put you at risk for serious  complications.  Your personal target blood pressure may vary depending on your medical conditions, your age, and other factors. For most people, a normal blood pressure is less than 120/80.  Hypertension is managed by lifestyle changes, medicines, or both. Lifestyle changes include weight loss, eating a healthy, low-sodium diet, exercising more, and limiting alcohol. This information is not intended to replace advice given to you by your health care provider. Make sure you discuss any questions you have with your health care provider. Document Released: 07/13/2012 Document Revised: 09/16/2016 Document Reviewed: 09/16/2016 Elsevier Interactive Patient Education  2019 Elsevier Inc.  

## 2019-07-19 ENCOUNTER — Ambulatory Visit: Payer: Self-pay

## 2019-07-19 ENCOUNTER — Other Ambulatory Visit: Payer: Self-pay

## 2019-07-19 DIAGNOSIS — Z23 Encounter for immunization: Secondary | ICD-10-CM

## 2020-10-30 ENCOUNTER — Ambulatory Visit: Payer: Self-pay | Admitting: Nurse Practitioner

## 2020-10-30 ENCOUNTER — Other Ambulatory Visit: Payer: Self-pay

## 2020-10-30 VITALS — BP 140/90

## 2020-10-30 DIAGNOSIS — Z0130 Encounter for examination of blood pressure without abnormal findings: Secondary | ICD-10-CM

## 2021-07-29 ENCOUNTER — Ambulatory Visit: Payer: Self-pay

## 2021-07-29 ENCOUNTER — Other Ambulatory Visit: Payer: Self-pay

## 2021-07-29 NOTE — Progress Notes (Signed)
Nutrition: 07/29/2021  CC: Looking for general guidance for losing weight.   HT: 5'2'  WT: 151 lbs  BMI: 26.9  Labs: 12/11/20 Total Chol=254, TG= 39, HDL= 108,  LDL= 138, glucose= 126.    Reports a weight gain of 10 lbs this last year.  GYN doctor recommended increasing the protein in her diet and to increase the weight training part of her exercise regimen.   She is currently increasing the intensity of the weight training at the gym and she is walking every day weather is fit.  Walks  at a lunch 3-4 days a week.    Nutrition recall: AM; coffee with half and half. It is 50% decaf/caff.   Lunch: Salad with added egg or meat or both. Or cottage cheese with fruit and nuts. Or an Owyn protein shake which is vegetarian protein.  Or Athena Baltz have shake and an cottage cheese.  Will drink water . Dinner: Husband generally cooks. Generally they have meat (chicken, beef, pork), a green vegetable, a starch.  Sometimes they will share a serving of dessert or maybe have a serving of ice cream.  Recommendations: For 1600 calorie diet, plan for protein at 120 gm/day.  1 oz meat = 7 gm protein.  3 oz = 21 gm protein.   Use food label is protein resource. Use protein shakes when meals become more variable. Use whole grains that have protein in them. Monitor portions and share desserts.  Education Materials: [employer] handout Cholesterol Handout Food Label handout  FU: As desired for further support.  Maggie Watson Robarge, RN,RD, LDN

## 2022-09-16 ENCOUNTER — Ambulatory Visit (INDEPENDENT_AMBULATORY_CARE_PROVIDER_SITE_OTHER): Payer: Self-pay | Admitting: Nurse Practitioner

## 2022-09-16 ENCOUNTER — Encounter: Payer: Self-pay | Admitting: Nurse Practitioner

## 2022-09-16 VITALS — BP 142/78 | HR 89 | Temp 97.8°F | Ht 63.0 in | Wt 158.2 lb

## 2022-09-16 DIAGNOSIS — S40811A Abrasion of right upper arm, initial encounter: Secondary | ICD-10-CM

## 2022-09-16 NOTE — Progress Notes (Signed)
[facility] [address] [place], [place] [address] [phone]  Office Visit Note  Patient Name: Phyllis Gonzalez Date of Birth [id number]  Medical Record number [id number]  Date of Service: 09/16/2022  Chief Complaint  Patient presents with   Arm Pain    Happened Sat. Open the kitchen cabinet and pulled the skin up. Has been keeping it clean.     HPI 66 year old female presenting to [facility] for wound assessment after cutting her right arm on a cabinet 4 days ago. She has been keeping the area clean and using topical antibiotic over the counter ointment as well.   Current Medication:  Outpatient Encounter Medications as of 09/16/2022  Medication Sig   diltiazem (CARDIZEM CD) 120 MG 24 hr capsule Take by mouth.   estradiol (ESTRACE) 0.1 MG/GM vaginal cream Place vaginally.   LUTEIN PO Take by mouth.   VAGIFEM 10 MCG TABS vaginal tablet  (Patient not taking: Reported on 09/16/2022)   No facility-administered encounter medications on file as of 09/16/2022.      Medical History: Past Medical History:  Diagnosis Date   Abnormal stress test    Kidney stone      Vital Signs: BP (!) 142/78 (BP Location: Left Arm, Patient Position: Sitting, Cuff Size: Normal)   Pulse 89   Temp 97.8 F (36.6 C) (Tympanic)   Ht 5\' 3"  (1.6 m)   Wt 158 lb 3.2 oz (71.8 kg)   SpO2 98%   BMI 28.02 kg/m      Physical Exam HENT:     Head: Normocephalic.  Pulmonary:     Effort: Pulmonary effort is normal.  Skin:    General: Skin is warm.     Findings: Abrasion present.          Comments: Surrounding skin WNL, skin temperature uniform, no active drainage edges well approximated   Neurological:     General: No focal deficit present.     Mental Status: She is alert.  Psychiatric:        Mood and Affect: Mood normal.       Assessment/Plan: 1. Abrasion of right upper extremity, initial encounter Continue to keep wound clean with mild soap and water  and use topical antibiotic ointment with bandaid to wound.   Return to office with any concerns as discussed     General Counseling: Phyllis Gonzalez verbalizes understanding of the findings of todays visit and agrees with plan of treatment. I have discussed any further diagnostic evaluation that may be needed or ordered today. We also reviewed her medications today. she has been encouraged to call the office with any questions or concerns that should arise related to todays visit.    Time spent:10 Minutes   [facility] Nurse Practitioner

## 2023-06-21 ENCOUNTER — Encounter: Payer: Self-pay | Admitting: Physician Assistant

## 2023-06-21 ENCOUNTER — Other Ambulatory Visit: Payer: Self-pay

## 2023-06-21 ENCOUNTER — Ambulatory Visit (INDEPENDENT_AMBULATORY_CARE_PROVIDER_SITE_OTHER): Payer: Self-pay | Admitting: Physician Assistant

## 2023-06-21 VITALS — BP 130/86 | HR 77 | Temp 98.2°F

## 2023-06-21 DIAGNOSIS — J029 Acute pharyngitis, unspecified: Secondary | ICD-10-CM

## 2023-06-21 LAB — POC COVID19 BINAXNOW: SARS Coronavirus 2 Ag: NEGATIVE

## 2023-06-21 NOTE — Progress Notes (Signed)
[facility] [address] [place], [place] [address]   Office Visit Note  Patient Name: Phyllis Gonzalez Date of Birth [id number]  Medical Record number [id number]  Date of Service: 06/21/2023  Chief Complaint  Patient presents with   Acute Visit    Patient c/o sore throat and nasal congestion that began yesterday evening. Denies fever/SOB. She has not tried any OTC medications for treatment of symptoms. She is requesting to be tested for COVID today.      67 y/o F presents to the clinic for c/o sore throat and slight nasal congestion x 1 day. She denies known exposure to covid. No fever or chills. No CP or wheezing. Hasn't taken any medicine for her symptoms yet.       Current Medication:  Outpatient Encounter Medications as of 06/21/2023  Medication Sig   diltiazem (CARDIZEM CD) 120 MG 24 hr capsule Take 120 mg by mouth daily.   estradiol (ESTRACE) 0.1 MG/GM vaginal cream Place vaginally.   Multiple Vitamin (MULTIVITAMIN) capsule Take 1 capsule by mouth daily.   [DISCONTINUED] LUTEIN PO Take by mouth.   [DISCONTINUED] VAGIFEM 10 MCG TABS vaginal tablet  (Patient not taking: Reported on 09/16/2022)   No facility-administered encounter medications on file as of 06/21/2023.      Medical History: Past Medical History:  Diagnosis Date   Abnormal stress test    Kidney stone      Vital Signs: BP 130/86   Pulse 77   Temp 98.2 F (36.8 C)   SpO2 97%    Review of Systems  Constitutional: Negative.   HENT:  Positive for congestion, sore throat and trouble swallowing. Negative for postnasal drip, sinus pressure and sinus pain.   Respiratory: Negative.    Cardiovascular: Negative.   Neurological: Negative.     Physical Exam Constitutional:      Appearance: Normal appearance.  HENT:     Head: Atraumatic.     Right Ear: Tympanic membrane, ear canal and external ear normal.     Left Ear: Tympanic membrane, ear canal and external ear normal.     Nose: Nose  normal.     Mouth/Throat:     Mouth: Mucous membranes are moist.     Pharynx: Oropharynx is clear.  Eyes:     Extraocular Movements: Extraocular movements intact.  Cardiovascular:     Rate and Rhythm: Normal rate and regular rhythm.  Pulmonary:     Effort: Pulmonary effort is normal.     Breath sounds: Normal breath sounds.  Musculoskeletal:     Cervical back: Neck supple.  Skin:    General: Skin is warm.  Neurological:     Mental Status: She is alert.  Psychiatric:        Mood and Affect: Mood normal.        Behavior: Behavior normal.        Thought Content: Thought content normal.        Judgment: Judgment normal.       Assessment/Plan:  1. Sore throat - POC COVID-19 [employer]  Reviewed negative covid test result with patient.  Stay well hydrated with clear liquids If symptoms don't improve then re-test at home in two days. Continue to watch for worsening symptoms. Consider wearing a mask while at workplace. Pt verbalized understanding and in agreement.    General Counseling: Kandy verbalizes understanding of the findings of todays visit and agrees with plan of treatment. I have discussed any further diagnostic evaluation that may be  needed or ordered today. We also reviewed her medications today. she has been encouraged to call the office with any questions or concerns that should arise related to todays visit.    Time spent:20 Minutes    Gilberto Better, [place] Physician Assistant
